# Patient Record
Sex: Female | Born: 1985 | Race: Black or African American | Hispanic: No | Smoking: Never smoker
Health system: Southern US, Community
[De-identification: ages and names within clinical notes are randomized; demographics above are authoritative.]

## PROBLEM LIST (undated history)

## (undated) DIAGNOSIS — Z789 Other specified health status: Secondary | ICD-10-CM

## (undated) HISTORY — PX: CERVICAL CERCLAGE: SHX1329

---

## 2016-03-03 ENCOUNTER — Encounter (HOSPITAL_COMMUNITY): Payer: Self-pay

## 2016-03-03 ENCOUNTER — Emergency Department (HOSPITAL_COMMUNITY)
Admission: EM | Admit: 2016-03-03 | Discharge: 2016-03-03 | Disposition: A | Discharge status: Home / Self Care | Payer: Medicaid Other | Attending: Emergency Medicine | Admitting: Emergency Medicine

## 2016-03-03 ENCOUNTER — Emergency Department (HOSPITAL_COMMUNITY): Payer: Medicaid Other

## 2016-03-03 DIAGNOSIS — N8300 Follicular cyst of ovary, unspecified side: Secondary | ICD-10-CM

## 2016-03-03 DIAGNOSIS — D259 Leiomyoma of uterus, unspecified: Secondary | ICD-10-CM | POA: Diagnosis not present

## 2016-03-03 DIAGNOSIS — R102 Pelvic and perineal pain: Secondary | ICD-10-CM | POA: Insufficient documentation

## 2016-03-03 DIAGNOSIS — N8301 Follicular cyst of right ovary: Secondary | ICD-10-CM | POA: Diagnosis not present

## 2016-03-03 DIAGNOSIS — R110 Nausea: Secondary | ICD-10-CM | POA: Insufficient documentation

## 2016-03-03 DIAGNOSIS — D509 Iron deficiency anemia, unspecified: Secondary | ICD-10-CM | POA: Diagnosis not present

## 2016-03-03 DIAGNOSIS — R1030 Lower abdominal pain, unspecified: Secondary | ICD-10-CM | POA: Diagnosis present

## 2016-03-03 LAB — WET PREP, GENITAL
Clue Cells Wet Prep HPF POC: NONE SEEN
Sperm: NONE SEEN
Trich, Wet Prep: NONE SEEN
Yeast Wet Prep HPF POC: NONE SEEN

## 2016-03-03 LAB — CBC WITH DIFFERENTIAL/PLATELET
BASOS PCT: 1 %
Basophils Absolute: 0 10*3/uL (ref 0.0–0.1)
EOS PCT: 2 %
Eosinophils Absolute: 0.1 10*3/uL (ref 0.0–0.7)
HCT: 27.6 % — ABNORMAL LOW (ref 36.0–46.0)
Hemoglobin: 7.8 g/dL — ABNORMAL LOW (ref 12.0–15.0)
LYMPHS ABS: 1.1 10*3/uL (ref 0.7–4.0)
Lymphocytes Relative: 28 %
MCH: 17.6 pg — AB (ref 26.0–34.0)
MCHC: 28.3 g/dL — ABNORMAL LOW (ref 30.0–36.0)
MCV: 62.2 fL — AB (ref 78.0–100.0)
MONO ABS: 0.2 10*3/uL (ref 0.1–1.0)
Monocytes Relative: 6 %
NEUTROS ABS: 2.6 10*3/uL (ref 1.7–7.7)
NEUTROS PCT: 63 %
PLATELETS: 273 10*3/uL (ref 150–400)
RBC: 4.44 MIL/uL (ref 3.87–5.11)
RDW: 19 % — ABNORMAL HIGH (ref 11.5–15.5)
WBC: 4 10*3/uL (ref 4.0–10.5)

## 2016-03-03 LAB — COMPREHENSIVE METABOLIC PANEL
ALT: 12 U/L — AB (ref 14–54)
AST: 20 U/L (ref 15–41)
Albumin: 3.9 g/dL (ref 3.5–5.0)
Alkaline Phosphatase: 35 U/L — ABNORMAL LOW (ref 38–126)
Anion gap: 7 (ref 5–15)
BUN: 9 mg/dL (ref 6–20)
CALCIUM: 9 mg/dL (ref 8.9–10.3)
CHLORIDE: 107 mmol/L (ref 101–111)
CO2: 23 mmol/L (ref 22–32)
CREATININE: 0.76 mg/dL (ref 0.44–1.00)
Glucose, Bld: 83 mg/dL (ref 65–99)
Potassium: 3.8 mmol/L (ref 3.5–5.1)
Sodium: 137 mmol/L (ref 135–145)
Total Bilirubin: 0.3 mg/dL (ref 0.3–1.2)
Total Protein: 7 g/dL (ref 6.5–8.1)

## 2016-03-03 LAB — URINALYSIS, ROUTINE W REFLEX MICROSCOPIC
BILIRUBIN URINE: NEGATIVE
Glucose, UA: NEGATIVE mg/dL
HGB URINE DIPSTICK: NEGATIVE
KETONES UR: NEGATIVE mg/dL
Leukocytes, UA: NEGATIVE
Nitrite: NEGATIVE
PROTEIN: NEGATIVE mg/dL
Specific Gravity, Urine: 1.013 (ref 1.005–1.030)
pH: 6.5 (ref 5.0–8.0)

## 2016-03-03 LAB — HCG, QUANTITATIVE, PREGNANCY: hCG, Beta Chain, Quant, S: <1 m[IU]/mL (ref <5)

## 2016-03-03 LAB — LIPASE, BLOOD: LIPASE: 39 U/L (ref 11–51)

## 2016-03-03 LAB — POC URINE PREG, ED: PREG TEST UR: NEGATIVE

## 2016-03-03 MED ORDER — ONDANSETRON 4 MG PO TBDP
8.0000 mg | ORAL_TABLET | Freq: Once | ORAL | Status: AC
  Administered 2016-03-03: 8 mg via ORAL
  Filled 2016-03-03: qty 2

## 2016-03-03 MED ORDER — ONDANSETRON HCL 4 MG/2ML IJ SOLN: 4.0000 mg | Freq: Once | INTRAMUSCULAR | Status: DC

## 2016-03-03 MED ORDER — ONDANSETRON HCL 8 MG PO TABS: 8.0000 mg | ORAL_TABLET | Freq: Three times a day (TID) | ORAL | 0 refills | Status: DC | PRN

## 2016-03-03 MED ORDER — SODIUM CHLORIDE 0.9 % IV BOLUS (SEPSIS): 1000.0000 mL | Freq: Once | INTRAVENOUS | Status: DC

## 2016-03-03 MED ORDER — FERROUS SULFATE 325 (65 FE) MG PO TABS: 325.0000 mg | ORAL_TABLET | Freq: Every day | ORAL | 0 refills | Status: DC

## 2016-03-03 NOTE — ED Notes (Signed)
Attempted IV unsuccessfully  

## 2016-03-03 NOTE — ED Notes (Signed)
EDP at bedside  

## 2016-03-03 NOTE — ED Notes (Signed)
Attempted peripheral IV x2. PA aware.

## 2016-03-03 NOTE — ED Notes (Addendum)
Patient transported to Ultrasound 

## 2016-03-03 NOTE — ED Provider Notes (Signed)
[facility] DEPT Provider Note   CSN: [id number] Arrival date & time: 03/03/16  1054     History   Chief Complaint Chief Complaint  Patient presents with  . Abdominal Pain    HPI Rhonda Hoover is a 30 y.o. [phone] female with a PMHx of anemia and uterine fibroids, and PSHx of 2 C/S and cervical cerclage, who presents to the ED with complaints of suprapubic abdominal pain that began yesterday. She describes the pain is 8/10 intermittent sharp suprapubic pain radiating into the right lower quadrant, worse with movement, and with no treatments tried prior to arrival. Associated symptoms include some nausea. She took 2 home pregnancy tests which were both positive. She has had 6 pregnancies including this current one, 2 prior SAbs at 16 and 20 wks, and 2 preterm deliveries at 31wks and 35 wks, 1 term delivery; has 3 living biological children and one adopted child.  No hx of ectopic pregnancies that she's aware of, and aside from cervical incompetency and preterm deliveries, she hasn't had any additional issues with her prior pregnancies. Typically has a regular menses, but LMP 01/14/16 and then had some mild spotting on 02/13/16 but it wasn't a full menses so she counts that as a missed menstrual cycle. No other prior abd surgeries aside from C-sections mentioned above. She is sexually active with one female partner, protected with condoms. Does not take any hormonal birth control therapies.  She denies any fevers, chills, chest pain, shortness breath, recent vomiting, diarrhea, constipation, melena, hematochezia, obstipation, dysuria, hematuria, vaginal bleeding or discharge, genital sores or lesions, vaginal itching, numbness, tingling, focal weakness, recent antibiotic use, sick contacts, suspicious food intake, recent travel, alcohol use, or chronic NSAID use.   The history is provided by the patient. No language interpreter was used.  Abdominal Pain   This is a new problem. The current episode  started yesterday. Episode frequency: intermittently. The problem has not changed since onset.The pain is associated with an unknown factor. The pain is located in the suprapubic region. The quality of the pain is sharp. The pain is at a severity of 8/10. The pain is moderate. Associated symptoms include nausea. Pertinent negatives include fever, diarrhea, flatus, hematochezia, melena, vomiting, constipation, dysuria, hematuria, arthralgias and myalgias. The symptoms are aggravated by activity. Nothing relieves the symptoms. Past medical history comments: uterine fibroids, prior miscarriages.    History reviewed. No pertinent past medical history.  There are no active problems to display for this patient.   Past Surgical History:  Procedure Laterality Date  . CESAREAN SECTION     x2    OB History    Gravida Para Term Preterm AB Living   1             SAB TAB Ectopic Multiple Live Births                   Home Medications    Prior to Admission medications   Not on File    Family History No family history on file.  Social History Social History  Substance Use Topics  . Smoking status: Not on file  . Smokeless tobacco: Not on file  . Alcohol use Not on file     Allergies   Penicillins   Review of Systems Review of Systems  Constitutional: Negative for chills and fever.  Respiratory: Negative for shortness of breath.   Cardiovascular: Negative for chest pain.  Gastrointestinal: Positive for abdominal pain (suprapubic) and nausea. Negative for blood in  stool, constipation, diarrhea, flatus, hematochezia, melena and vomiting.  Genitourinary: Positive for menstrual problem (missed menses this month). Negative for dysuria, genital sores, hematuria, vaginal bleeding, vaginal discharge and vaginal pain.  Musculoskeletal: Negative for arthralgias and myalgias.  Skin: Negative for color change.  Allergic/Immunologic: Negative for immunocompromised state.  Neurological:  Negative for weakness and numbness.  Psychiatric/Behavioral: Negative for confusion.   10 Systems reviewed and are negative for acute change except as noted in the HPI.   Physical Exam Updated Vital Signs BP 131/95 (BP Location: Right Arm)   Pulse 74   Temp 98.1 F (36.7 C) (Oral)   Resp 16   LMP 01/14/2016 (Exact Date)   SpO2 100%   Physical Exam  Constitutional: She is oriented to person, place, and time. Vital signs are normal. She appears well-developed and well-nourished.  Non-toxic appearance. No distress.  Afebrile, nontoxic, NAD  HENT:  Head: Normocephalic and atraumatic.  Mouth/Throat: Oropharynx is clear and moist and mucous membranes are normal.  Eyes: Conjunctivae and EOM are normal. Right eye exhibits no discharge. Left eye exhibits no discharge.  Neck: Normal range of motion. Neck supple.  Cardiovascular: Normal rate, regular rhythm, normal heart sounds and intact distal pulses.  Exam reveals no gallop and no friction rub.   No murmur heard. Pulmonary/Chest: Effort normal and breath sounds normal. No respiratory distress. She has no decreased breath sounds. She has no wheezes. She has no rhonchi. She has no rales.  Abdominal: Soft. Normal appearance and bowel sounds are normal. She exhibits no distension. There is tenderness in the suprapubic area. There is no rigidity, no rebound, no guarding, no CVA tenderness, no tenderness at McBurney's point and negative Murphy's sign.    Soft, nondistended, +BS throughout, with mild suprapubic TTP, no appreciable RLQ TTP, no r/g/r, neg murphy's, neg mcburney's, no CVA TTP   Genitourinary: Pelvic exam was performed with patient supine. There is no rash, tenderness or lesion on the right labia. There is no rash, tenderness or lesion on the left labia. Uterus is enlarged and tender. Uterus is not deviated and not fixed. Cervix exhibits no motion tenderness, no discharge and no friability. Right adnexum displays tenderness. Right adnexum  displays no mass and no fullness. Left adnexum displays no mass, no tenderness and no fullness. No erythema, tenderness or bleeding in the vagina. Vaginal discharge (scant white) found.  Genitourinary Comments: Chaperone present for exam. No rashes, lesions, or tenderness to external genitalia. No erythema, injury, or tenderness to vaginal mucosa. +Scant white vaginal discharge without bleeding within vaginal vault. No adnexal masses or fullness, but some mild R adnexal tenderness. No CMT, cervical friability, or discharge from cervical os. Cervical os is closed. Uterus non-deviated, mobile, with slight diffuse TTP and mild enlargement up to pubic symphysis.  Musculoskeletal: Normal range of motion.  Neurological: She is alert and oriented to person, place, and time. She has normal strength. No sensory deficit.  Skin: Skin is warm, dry and intact. No rash noted.  Psychiatric: She has a normal mood and affect.  Nursing note and vitals reviewed.    ED Treatments / Results  Labs (all labs ordered are listed, but only abnormal results are displayed) Labs Reviewed  WET PREP, GENITAL - Abnormal; Notable for the following:       Result Value   WBC, Wet Prep HPF POC MODERATE (*)    All other components within normal limits  CBC WITH DIFFERENTIAL/PLATELET - Abnormal; Notable for the following:    Hemoglobin 7.8 (*)  HCT 27.6 (*)    MCV 62.2 (*)    MCH 17.6 (*)    MCHC 28.3 (*)    RDW 19.0 (*)    All other components within normal limits  COMPREHENSIVE METABOLIC PANEL - Abnormal; Notable for the following:    ALT 12 (*)    Alkaline Phosphatase 35 (*)    All other components within normal limits  URINALYSIS, ROUTINE W REFLEX MICROSCOPIC (NOT AT [facility]) - Abnormal; Notable for the following:    Color, Urine AMBER (*)    All other components within normal limits  LIPASE, BLOOD  HCG, QUANTITATIVE, PREGNANCY  RPR  HIV ANTIBODY (ROUTINE TESTING)  POC URINE PREG, ED  GC/CHLAMYDIA PROBE AMP  ([place]) NOT AT [facility]    EKG  EKG Interpretation None       Radiology US Transvaginal Non-ob  Result Date: 03/03/2016 CLINICAL DATA:  Right pelvic pain starting yesterday EXAM: TRANSABDOMINAL AND TRANSVAGINAL ULTRASOUND OF PELVIS DOPPLER ULTRASOUND OF OVARIES TECHNIQUE: Both transabdominal and transvaginal ultrasound examinations of the pelvis were performed. Transabdominal technique was performed for global imaging of the pelvis including uterus, ovaries, adnexal regions, and pelvic cul-de-sac. It was necessary to proceed with endovaginal exam following the transabdominal exam to visualize the ovaries. Color and duplex Doppler ultrasound was utilized to evaluate blood flow to the ovaries. COMPARISON:  None. FINDINGS: Uterus Measurements: 8.4 x 5.6 x 8.7 cm. The uterus is retroflexed. There is a left posterior fundal calcified myometrial lesion measures 5.4 by 4.6 cm. This is suspicious for a calcified fibroid. Correlation with GYN exam and further correlation with CT scan or MRI is recommended. Endometrium Thickness: 1.6 cm.  No focal abnormality visualized. Right ovary Measurements: 3.6 x 2.3 by 2.7 cm. A complex follicle is noted measures 1.5 x 1.2 cm. Left ovary Measurements: 3.3 x 0.7 x 2 cm. Normal appearance/no adnexal mass. Pulsed Doppler evaluation of both ovaries demonstrates normal low-resistance arterial and venous waveforms. Other findings Trace free fluid. IMPRESSION: 1. Retroflexed uterus. Probable calcified left fundal posterior fibroid measures 5.4 x 4.6 cm. Further correlation with CT scan or MRI is recommended. 2. No adnexal mass. Complex follicle right ovary measures 1.5 x 1.2 cm. Trace pelvic free fluid. Electronically Signed   By: Lahoma Crocker M.D.   On: 03/03/2016 13:15   US Pelvis Complete  Result Date: 03/03/2016 CLINICAL DATA:  Right pelvic pain starting yesterday EXAM: TRANSABDOMINAL AND TRANSVAGINAL ULTRASOUND OF PELVIS DOPPLER ULTRASOUND OF OVARIES TECHNIQUE: Both  transabdominal and transvaginal ultrasound examinations of the pelvis were performed. Transabdominal technique was performed for global imaging of the pelvis including uterus, ovaries, adnexal regions, and pelvic cul-de-sac. It was necessary to proceed with endovaginal exam following the transabdominal exam to visualize the ovaries. Color and duplex Doppler ultrasound was utilized to evaluate blood flow to the ovaries. COMPARISON:  None. FINDINGS: Uterus Measurements: 8.4 x 5.6 x 8.7 cm. The uterus is retroflexed. There is a left posterior fundal calcified myometrial lesion measures 5.4 by 4.6 cm. This is suspicious for a calcified fibroid. Correlation with GYN exam and further correlation with CT scan or MRI is recommended. Endometrium Thickness: 1.6 cm.  No focal abnormality visualized. Right ovary Measurements: 3.6 x 2.3 by 2.7 cm. A complex follicle is noted measures 1.5 x 1.2 cm. Left ovary Measurements: 3.3 x 0.7 x 2 cm. Normal appearance/no adnexal mass. Pulsed Doppler evaluation of both ovaries demonstrates normal low-resistance arterial and venous waveforms. Other findings Trace free fluid. IMPRESSION: 1. Retroflexed uterus. Probable calcified left  fundal posterior fibroid measures 5.4 x 4.6 cm. Further correlation with CT scan or MRI is recommended. 2. No adnexal mass. Complex follicle right ovary measures 1.5 x 1.2 cm. Trace pelvic free fluid. Electronically Signed   By: Lahoma Crocker M.D.   On: 03/03/2016 13:15   [place] Art/ven Flow Abd Pelv Doppler  Result Date: 03/03/2016 CLINICAL DATA:  Right pelvic pain starting yesterday EXAM: TRANSABDOMINAL AND TRANSVAGINAL ULTRASOUND OF PELVIS DOPPLER ULTRASOUND OF OVARIES TECHNIQUE: Both transabdominal and transvaginal ultrasound examinations of the pelvis were performed. Transabdominal technique was performed for global imaging of the pelvis including uterus, ovaries, adnexal regions, and pelvic cul-de-sac. It was necessary to proceed with endovaginal exam  following the transabdominal exam to visualize the ovaries. Color and duplex Doppler ultrasound was utilized to evaluate blood flow to the ovaries. COMPARISON:  None. FINDINGS: Uterus Measurements: 8.4 x 5.6 x 8.7 cm. The uterus is retroflexed. There is a left posterior fundal calcified myometrial lesion measures 5.4 by 4.6 cm. This is suspicious for a calcified fibroid. Correlation with GYN exam and further correlation with CT scan or MRI is recommended. Endometrium Thickness: 1.6 cm.  No focal abnormality visualized. Right ovary Measurements: 3.6 x 2.3 by 2.7 cm. A complex follicle is noted measures 1.5 x 1.2 cm. Left ovary Measurements: 3.3 x 0.7 x 2 cm. Normal appearance/no adnexal mass. Pulsed Doppler evaluation of both ovaries demonstrates normal low-resistance arterial and venous waveforms. Other findings Trace free fluid. IMPRESSION: 1. Retroflexed uterus. Probable calcified left fundal posterior fibroid measures 5.4 x 4.6 cm. Further correlation with CT scan or MRI is recommended. 2. No adnexal mass. Complex follicle right ovary measures 1.5 x 1.2 cm. Trace pelvic free fluid. Electronically Signed   By: Lahoma Crocker M.D.   On: 03/03/2016 13:15    Procedures Procedures (including critical care time)  Medications Ordered in ED Medications  ondansetron (ZOFRAN-ODT) disintegrating tablet 8 mg (8 mg Oral Given 03/03/16 1418)     Initial Impression / Assessment and Plan / ED Course  I have reviewed the triage vital signs and the nursing notes.  Pertinent labs & imaging results that were available during my care of the patient were reviewed by me and considered in my medical decision making (see chart for details).  Clinical Course    30 y.o. [id number] female here with intermittent suprapubic/RLQ pain x1 day, +UPT at home x2 (EGA [redacted]w[redacted]d based on LMP), some nausea but no recent vomiting. No known prior ectopic pregnancies, but has had 2 preterm deliveries and 2 SAbs (16 and 20 wks). Has hx of  fibroids. 2 C/S surgeries but no other abd surgeries. No vaginal bleeding/discharge. On exam, mild suprapubic TTP, no RLQ TTP appreciated, nonperitoneal exam, no flank TTP. Will obtain CBC w/diff, CMP, lipase, U/A, Upreg, quant HCG, STD panel, wet prep and pelvic exam; will give fluids and zofran, pt declines wanting pain meds. Will hold off on ordering ABO/Rh given that she has no vaginal bleeding. Will hold off on U/S orders until we have a quantHCG so we know how to proceed. Will reassess shortly  11:41 AM Pelvic exam reveals some scant white discharge in vault, no cervical discharge, closed cervical os, no vaginal bleeding, no CMT but some mild diffuse uterine tenderness and enlargement up to pubic symphysis. Remaining labs not yet done, so unclear what her quantHCG will be; will proceed with ordering U/S but will obviously need to wait for quantHCG to determine which type of U/S to get. Will hold off on  empiric GC/CT coverage until wet prep results, low suspicion for PID/STD at this time. Will reassess after labs/imaging returns.   11:55 AM Upreg neg, changed U/S orders to be transvaginal non-OB, pelvis complete, and doppler to r/o torsion vs other etiologies. Remaining labs pending. Will reassess shortly.   2:31 PM CBC w/diff with Hgb 7.8 (unclear baseline but pt recalls being told she was anemic previously, can't recall her baseline Hgb though), microcytic so likely iron deficiency, will need to start on iron supplementation; differential and platelet count unremarkable, polychromasia and elliptocytes and burr cells present on smear (unclear if this is a new finding or not). CMP WNL. Lipase WNL. U/A WNL and without evidence of UTI. Quant HCG <1 so could be that she recently miscarried vs false+ test at home, but either way she shouldn't need any further intervention at this time. Wet prep with some WBCs but otherwise neg, doubt need for empiric GC/CT treatment, will await results from testing  performed today, discussed abstinence until she gets results of testing. U/S reveals retroflexed uterus and probably calcified L fundal posterior fibroid, and complex follicle on R ovary, otherwise unremarkable. These could explain her symptoms, she will need OBGYN f/up for management/eval of her fibroids. Unable to obtain IV access, but pt tolerating PO well and feeling well/better after zofran, continued to decline wanting anything for pain. Will d/c home with zofran and iron, discussed use of tylenol/motrin for pain, and f/up with OBGYN/women's clinic for ongoing management of her pelvic pain/fibroids and with her PCP for management of her anemia; no need for transfusion emergently today but discussed strict return precautions of s/sx of worsening anemia that would warrant recheck/emergent treatment. I explained the diagnosis and have given explicit precautions to return to the ER including for any other new or worsening symptoms. The patient understands and accepts the medical plan as it's been dictated and I have answered their questions. Discharge instructions concerning home care and prescriptions have been given. The patient is STABLE and is discharged to home in good condition.    Final Clinical Impressions(s) / ED Diagnoses   Final diagnoses:  Suprapubic pain  Nausea  Right adnexal tenderness  Uterine leiomyoma, unspecified location  Ovarian follicular cyst  Iron deficiency anemia, unspecified iron deficiency anemia type    New Prescriptions New Prescriptions   FERROUS SULFATE 325 (65 FE) MG TABLET    Take 1 tablet (325 mg total) by mouth daily with breakfast. TAKE WITH ORANGE JUICE   ONDANSETRON (ZOFRAN) 8 MG TABLET    Take 1 tablet (8 mg total) by mouth every 8 (eight) hours as needed for nausea or vomiting.     Raquelle Pietro Camprubi-Soms, PA-C 03/03/16 1432    Isla Pence, MD 03/04/16 [phone]

## 2016-03-03 NOTE — ED Triage Notes (Signed)
Pt Presents for evaluation of sharp suprapubic pain x 2 days. Pt. Reports LMP 01/14/16, reports 2 positive home pregnancy tests. Pt. Has 4 healthy children prior, last baby was 31 weeks at birth. Pt. Reports hx of fibroids. Pt. Reports pain is intermittent and requires her to "stop what shes doing." Pain worse with movement.

## 2016-03-03 NOTE — Discharge Instructions (Signed)
Your abdominal pain is likely related to your uterine fibroids and a small follicular cyst on your right ovary that was seen on ultrasound. You may have miscarried earlier this month, but today's labs and imaging reassure [place] that you are not currently pregnant and should not need further intervention for any possible miscarriage that may have occurred earlier this month. Your labs revealed that you are anemic, start taking iron supplements as directed. Use zofran as directed as needed for nausea. Stay well hydrated with plenty of fluids. Use tylenol and motrin as needed for pain. Follow up with your primary care doctor and your OBGYN in the next 1 week for ongoing evaluation and management of your symptoms; if you don't have an OBGYN then follow up with the women's clinic to establish care and recheck your symptoms and for further management of your fibroids/pelvic pain. Return to the women's hospital [facility] (what they call their ER) as needed for changes/worsening symptoms.   You have been tested for gonorrhea, chlamydia, HIV, and Syphilis, and the hospital will call you if the lab is positive. DO NOT ENGAGE IN SEXUAL ACTIVITY UNTIL YOU FIND OUT ABOUT YOUR RESULTS. ALL PARTNERS MUST BE TESTED AND TREATED FOR STD'S PRIOR TO RE-ENGAGING IN SEXUAL ACTIVITY. ALWAYS USE CONDOMS WHEN ENGAGING IN INTERCOURSE.

## 2016-03-04 LAB — RPR: RPR: NONREACTIVE

## 2016-03-04 LAB — HIV ANTIBODY (ROUTINE TESTING W REFLEX): HIV Screen 4th Generation wRfx: NONREACTIVE

## 2016-03-05 LAB — GC/CHLAMYDIA PROBE AMP (~~LOC~~) NOT AT ARMC
Chlamydia: NEGATIVE
NEISSERIA GONORRHEA: NEGATIVE

## 2017-01-09 ENCOUNTER — Emergency Department (HOSPITAL_COMMUNITY): Payer: Self-pay

## 2017-01-09 ENCOUNTER — Emergency Department (HOSPITAL_COMMUNITY)
Admission: EM | Admit: 2017-01-09 | Discharge: 2017-01-10 | Disposition: A | Discharge status: Home / Self Care | Payer: Self-pay | Attending: Emergency Medicine | Admitting: Emergency Medicine

## 2017-01-09 ENCOUNTER — Encounter (HOSPITAL_COMMUNITY): Payer: Self-pay | Admitting: *Deleted

## 2017-01-09 DIAGNOSIS — H8110 Benign paroxysmal vertigo, unspecified ear: Secondary | ICD-10-CM | POA: Insufficient documentation

## 2017-01-09 DIAGNOSIS — M5416 Radiculopathy, lumbar region: Secondary | ICD-10-CM | POA: Insufficient documentation

## 2017-01-09 DIAGNOSIS — Z880 Allergy status to penicillin: Secondary | ICD-10-CM | POA: Insufficient documentation

## 2017-01-09 LAB — POC URINE PREG, ED: PREG TEST UR: NEGATIVE

## 2017-01-09 MED ORDER — PREDNISONE 10 MG PO TABS: ORAL_TABLET | ORAL | 0 refills | Status: DC

## 2017-01-09 MED ORDER — CYCLOBENZAPRINE HCL 10 MG PO TABS: 10.0000 mg | ORAL_TABLET | Freq: Two times a day (BID) | ORAL | 0 refills | Status: DC | PRN

## 2017-01-09 NOTE — ED Provider Notes (Signed)
[facility] DEPT Provider Note   CSN: [id number] Arrival date & time: 01/09/17  1907     History   Chief Complaint Chief Complaint  Patient presents with  . Back Pain    HPI Rhonda Hoover is a 31 y.o. female who presents to the emergency department with a chief complaint of low back pain. She reports a history of recurrent episodes of low back pain with radiation down her left leg. She reports the most recent episode began when she awoke this morning, but concerned her because she states that she felt something "shift" in her low back right when the pain began. She reports that the sharp pain radiates down her left hip and all the way down to her toes. She denies right-sided back pain, fever, chills, urinary or fecal incontinence, numbness, or weakness. No history of IV drug use. No history of cancer. She states that she is unable to walk, but she feels "pulling" in her left leg with ambulation.  She reports a history of migraines, and states that she has had an increase in the number of migraines over the last month. No current headache. She reports increasing instances of diplopia and dizziness with her headaches. She states that the dizziness will last for 2-3 seconds and is aggravated with positional changes. No other symptoms at this time. She reports that she's been treating her headaches with anti-inflammatories, with complete resolution.  Patient has a history of 3 C-sections and multiple cerclages. She denies dysuria, hematuria, frequency, or hesitancy, or vaginal pain, itching, or discharge.  The history is provided by the patient. No language interpreter was used.    History reviewed. No pertinent past medical history.  There are no active problems to display for this patient.   Past Surgical History:  Procedure Laterality Date  . CESAREAN SECTION     x2    OB History    Gravida Para Term Preterm AB Living   1             SAB TAB Ectopic Multiple Live Births               Home Medications    Prior to Admission medications   Medication Sig Start Date End Date Taking? Authorizing Provider  cyclobenzaprine (FLEXERIL) 10 MG tablet Take 1 tablet (10 mg total) by mouth 2 (two) times daily as needed for muscle spasms. 01/09/17   Ryhanna Dunsmore A, PA-C  ferrous sulfate 325 (65 FE) MG tablet Take 1 tablet (325 mg total) by mouth daily with breakfast. TAKE WITH ORANGE JUICE 03/03/16   Street, [place], PA-C  ondansetron (ZOFRAN) 8 MG tablet Take 1 tablet (8 mg total) by mouth every 8 (eight) hours as needed for nausea or vomiting. 03/03/16   Street, [place], PA-C  predniSONE (DELTASONE) 10 MG tablet Please take 6 tablets 4 days 1 through 5, 5 tablets on day 6, 4 tablets on day 7, 3 tablets on day 8, 2 tablets on day 9, and 1 tablet on day 10. 01/09/17   Talulah Schirmer A, PA-C    Family History History reviewed. No pertinent family history.  Social History Social History  Substance Use Topics  . Smoking status: Never Smoker  . Smokeless tobacco: Not on file  . Alcohol use No     Allergies   Penicillins   Review of Systems Review of Systems  Constitutional: Negative for activity change, chills and fever.  Respiratory: Negative for shortness of breath.   Cardiovascular: Negative for chest  pain.  Gastrointestinal: Negative for abdominal pain.  Musculoskeletal: Positive for arthralgias, gait problem and myalgias. Negative for back pain and joint swelling.  Skin: Negative for rash.  Neurological: Positive for dizziness and headaches. Negative for syncope, weakness, light-headedness and numbness.   Physical Exam Updated Vital Signs BP 107/73 (BP Location: Right Arm)   Pulse 80   Temp 98.8 F (37.1 C) (Oral)   Resp 16   LMP 01/02/2017   SpO2 99%   Breastfeeding? Unknown   Physical Exam  Constitutional: No distress.  HENT:  Head: Normocephalic.  Eyes: Conjunctivae are normal.  Neck: Neck supple.  Cardiovascular: Normal rate and regular  rhythm.  Exam reveals no gallop and no friction rub.   No murmur heard. Pulmonary/Chest: Effort normal. No respiratory distress.  Abdominal: Soft. She exhibits no distension.  Musculoskeletal: She exhibits tenderness. She exhibits no edema or deformity.  Tender to palpation over the spinous processes of the lumbar spine. Tender to palpation over the left-sided paraspinal muscles of the lumbar spine. No right-sided tenderness. No tenderness to palpation over the cervical or thoracic spinous processes or surrounding paraspinal muscles.  Positive straight-leg raise on the left. Full range of motion of the bilateral hips, knees, and ankles. No tenderness to palpation over the bilateral hips, knees, or ankles. 5 out of 5 strength of the bilateral lower extremities. Sensation to light touch is intact throughout the bilateral lower extremities. DP and PT pulses are 2+ bilaterally.  Neurological: She is alert.  Skin: Skin is warm. No rash noted.  Psychiatric: Her behavior is normal.  Nursing note and vitals reviewed.    ED Treatments / Results  Labs (all labs ordered are listed, but only abnormal results are displayed) Labs Reviewed  POC URINE PREG, ED    EKG  EKG Interpretation None       Radiology Dg Lumbar Spine Complete  Result Date: 01/09/2017 CLINICAL DATA:  Left lower back pain EXAM: LUMBAR SPINE - COMPLETE 4+ VIEW COMPARISON:  None. FINDINGS: Calcified fibroid in the pelvis. Normal alignment. Vertebral body heights and disc spaces are within normal limits IMPRESSION: Negative.  Calcified uterine fibroid Electronically Signed   By: Donavan Foil M.D.   On: 01/09/2017 23:34    Procedures Procedures (including critical care time)  Medications Ordered in ED Medications - No data to display   Initial Impression / Assessment and Plan / ED Course  I have reviewed the triage vital signs and the nursing notes.  Pertinent labs & imaging results that were available during my care of  the patient were reviewed by me and considered in my medical decision making (see chart for details).     Patient with back pain, likely lumbar radiculopathy.  No neurological deficits and normal neuro exam.  Patient can walk but states it is painful.  No urinary or bowel incontinence.  No concern for cauda equina, AAA, infection, or fracture.  No constitutional symptoms including fever, chills, or weight loss,  No h/o cancer, IVDU.  Will discharge to home with RICE protocol, prednisone taper, and anti-inflammatory medicine. Discussed ED return precaution and indications for PCP follow up. The patient acknowledges the plan and is agreeable at this time.   Final Clinical Impressions(s) / ED Diagnoses   Final diagnoses:  Lumbar radiculopathy  Benign paroxysmal positional vertigo, unspecified laterality    New Prescriptions Discharge Medication List as of 01/09/2017 11:59 PM    START taking these medications   Details  cyclobenzaprine (FLEXERIL) 10 MG tablet Take 1  tablet (10 mg total) by mouth 2 (two) times daily as needed for muscle spasms., Starting Wed 01/09/2017, Print    predniSONE (DELTASONE) 10 MG tablet Please take 6 tablets 4 days 1 through 5, 5 tablets on day 6, 4 tablets on day 7, 3 tablets on day 8, 2 tablets on day 9, and 1 tablet on day 10., Print         Joanne Gavel, PA-C 01/10/17 0125    Milton Ferguson, MD 01/10/17 1258

## 2017-01-09 NOTE — Discharge Instructions (Signed)
If you develop new or worsening symptoms, including symptoms in both legs, heaviness in her legs, or numbness, or loss of control of your bladder or bowels, fever, or chills, please return to the emergency department.  Please take 6 tablets of prednisone starting tomorrow for the next 5 days. On day 6, please take 5 tablets. On day 7, please take 4 tablets. On day 8, please take 3 tablets. On day 9, please take 2 tablets. On day 10, please take one tablet. Please make sure to take the tablets with food so they do not upset your stomach. You may also take Flexeril up to 2 times per day to help with muscle pain and spasms please do not take this medication before you work or drive because it can make you sleepy. You can apply ice to any areas that are sore for 15-20 minutes up to 3-4 times a day.   Stretches And symptoms also help with your symptoms. Here are some stretches that may help the muscles in your lower back.   Stretches for Low Back Pain      Back Flexion Stretch. Lying on the back, pull both knees to the chest while simultaneously flexing the head forward until a comfortable stretch is felt across the mid and low back.      Knee to Chest Stretch. Lie on the back with the knees bent and both heels on the floor, then place both hands behind one knee and pull it toward the chest, stretching the gluteus and piriformis muscles in the buttock.  Kneeling Lunge Stretch. Starting on both knees, move one leg forward so the foot is flat on the ground, keeping weight evenly distributed through both hips (rather than on one side or the other). Place both hands on the top of the thigh, and gently lean the body forward to feel a stretch in the front of the other leg. This stretch affects the hip flexor muscles, which attach to the pelvis and can impact posture if too tight.    Piriformis Muscle Stretch. Lie on the back with knees bent and both heels on the floor. Cross one leg over the other, resting the  ankle on the bent knee, then gently pull the bottom knee toward the chest until a stretch is felt in the buttock. Or, lying on the floor, cross one leg over the other and pull it forward over the body at the knee, keeping the other leg flat.

## 2017-01-09 NOTE — ED Triage Notes (Signed)
Pt reports left lower back pain for extended amount of time getting progressively worse. Denies injury to back. Is ambulatory at triage. States pain is radiating down her left leg. Denies urinary symptoms.

## 2018-05-27 ENCOUNTER — Emergency Department (HOSPITAL_COMMUNITY)
Admission: EM | Admit: 2018-05-27 | Discharge: 2018-05-27 | Disposition: A | Discharge status: Home / Self Care | Payer: Medicaid Other | Attending: Emergency Medicine | Admitting: Emergency Medicine

## 2018-05-27 DIAGNOSIS — R319 Hematuria, unspecified: Secondary | ICD-10-CM | POA: Insufficient documentation

## 2018-05-27 DIAGNOSIS — M545 Low back pain: Secondary | ICD-10-CM | POA: Diagnosis not present

## 2018-05-27 DIAGNOSIS — R300 Dysuria: Secondary | ICD-10-CM | POA: Diagnosis present

## 2018-05-27 DIAGNOSIS — Z79899 Other long term (current) drug therapy: Secondary | ICD-10-CM | POA: Insufficient documentation

## 2018-05-27 DIAGNOSIS — N390 Urinary tract infection, site not specified: Secondary | ICD-10-CM | POA: Diagnosis not present

## 2018-05-27 LAB — URINALYSIS, ROUTINE W REFLEX MICROSCOPIC
Bilirubin Urine: NEGATIVE
Glucose, UA: NEGATIVE mg/dL
Ketones, ur: NEGATIVE mg/dL
Nitrite: NEGATIVE
Protein, ur: NEGATIVE mg/dL
Specific Gravity, Urine: 1.005 (ref 1.005–1.030)
WBC, UA: >50 WBC/hpf — ABNORMAL HIGH (ref 0–5)
pH: 6 (ref 5.0–8.0)

## 2018-05-27 LAB — POC URINE PREG, ED: Preg Test, Ur: NEGATIVE

## 2018-05-27 MED ORDER — CEPHALEXIN 500 MG PO CAPS: 500.0000 mg | ORAL_CAPSULE | Freq: Four times a day (QID) | ORAL | 0 refills | Status: DC

## 2018-05-27 NOTE — Discharge Instructions (Addendum)
Please read attached information. If you experience any new or worsening signs or symptoms please return to the emergency room for evaluation. Please follow-up with your primary care provider or specialist as discussed. Please use medication prescribed only as directed and discontinue taking if you have any concerning signs or symptoms.   °

## 2018-05-27 NOTE — ED Notes (Signed)
Patient verbalized understanding of discharge instructions and denies any further needs or questions at this time. VS stable. Patient ambulatory with steady gait.  

## 2018-05-27 NOTE — ED Provider Notes (Signed)
[facility] EMERGENCY DEPARTMENT Provider Note   CSN: [id number] Arrival date & time: 05/27/18  1342    History   Chief Complaint Chief Complaint  Patient presents with  . Dysuria  . Back Pain    HPI Rhonda Hoover is a 33 y.o. female.  HPI   33 year old female with no significant past medical history presents today with complaints of dysuria.  Patient notes approximately 2 weeks ago she developed an odor, pain after urinating and suprapubic discomfort.  She notes this feels similar to previous urinary tract infections although slightly more severe.  She denies any recent infections or antibiotic use.  She notes yesterday she developed some minor bilateral low back pain.  She denies any fever nausea or vomiting.  She denies any upper abdominal pain.  No past medical history on file.  There are no active problems to display for this patient.   Past Surgical History:  Procedure Laterality Date  . CESAREAN SECTION     x2     OB History    Gravida  1   Para      Term      Preterm      AB      Living        SAB      TAB      Ectopic      Multiple      Live Births               Home Medications    Prior to Admission medications   Medication Sig Start Date End Date Taking? Authorizing Provider  cephALEXin (KEFLEX) 500 MG capsule Take 1 capsule (500 mg total) by mouth 4 (four) times daily. 05/27/18   Kellina Dreese, Dellis Filbert, PA-C  cyclobenzaprine (FLEXERIL) 10 MG tablet Take 1 tablet (10 mg total) by mouth 2 (two) times daily as needed for muscle spasms. 01/09/17   McDonald, Mia A, PA-C  ferrous sulfate 325 (65 FE) MG tablet Take 1 tablet (325 mg total) by mouth daily with breakfast. TAKE WITH ORANGE JUICE 03/03/16   Street, [place], PA-C  ondansetron (ZOFRAN) 8 MG tablet Take 1 tablet (8 mg total) by mouth every 8 (eight) hours as needed for nausea or vomiting. 03/03/16   Street, [place], PA-C  predniSONE (DELTASONE) 10 MG tablet Please take 6  tablets 4 days 1 through 5, 5 tablets on day 6, 4 tablets on day 7, 3 tablets on day 8, 2 tablets on day 9, and 1 tablet on day 10. 01/09/17   McDonald, Mia A, PA-C    Family History No family history on file.  Social History Social History   Tobacco Use  . Smoking status: Never Smoker  Substance Use Topics  . Alcohol use: No  . Drug use: No     Allergies   Penicillins   Review of Systems Review of Systems  All other systems reviewed and are negative.   Physical Exam Updated Vital Signs BP 132/80 (BP Location: Left Arm)   Pulse 67   Temp 98.2 F (36.8 C) (Oral)   Resp 14   Ht 5\' 7"  (1.702 m)   Wt 68 kg   LMP 04/28/2018 (Exact Date)   SpO2 100%   BMI 23.49 kg/m   Physical Exam Vitals signs and nursing note reviewed.  Constitutional:      Appearance: She is well-developed.  HENT:     Head: Normocephalic and atraumatic.  Eyes:     General: No scleral  icterus.       Right eye: No discharge.        Left eye: No discharge.     Conjunctiva/sclera: Conjunctivae normal.     Pupils: Pupils are equal, round, and reactive to light.  Neck:     Musculoskeletal: Normal range of motion.     Vascular: No JVD.     Trachea: No tracheal deviation.  Pulmonary:     Effort: Pulmonary effort is normal.     Breath sounds: No stridor.  Abdominal:     Comments: Abdomen soft nontender no CVA tenderness  Neurological:     Mental Status: She is alert and oriented to person, place, and time.     Coordination: Coordination normal.  Psychiatric:        Behavior: Behavior normal.        Thought Content: Thought content normal.        Judgment: Judgment normal.     ED Treatments / Results  Labs (all labs ordered are listed, but only abnormal results are displayed) Labs Reviewed  URINALYSIS, ROUTINE W REFLEX MICROSCOPIC - Abnormal; Notable for the following components:      Result Value   Color, Urine STRAW (*)    APPearance HAZY (*)    Hgb urine dipstick SMALL (*)     Leukocytes, UA LARGE (*)    WBC, UA >50 (*)    Bacteria, UA FEW (*)    All other components within normal limits  POC URINE PREG, ED    EKG None  Radiology No results found.  Procedures Procedures (including critical care time)  Medications Ordered in ED Medications - No data to display   Initial Impression / Assessment and Plan / ED Course  I have reviewed the triage vital signs and the nursing notes.  Pertinent labs & imaging results that were available during my care of the patient were reviewed by me and considered in my medical decision making (see chart for details).      Labs:   Imaging:  Consults:  Therapeutics:  Discharge Meds:   Assessment/Plan: 33 year old female presents today with complaints urinary tract infection.  She has no CVA tenderness but does complain of back pain.  Her urine consistent with urinary tract infection.  I do find it reasonable to treat her with higher dose Keflex.  She has no known anaphylactic reaction to penicillins.  Patient be given antibiotics, strict return precautions.  She verbalized understanding and agreement to today's plan.   Final Clinical Impressions(s) / ED Diagnoses   Final diagnoses:  Urinary tract infection with hematuria, site unspecified    ED Discharge Orders         Ordered    cephALEXin (KEFLEX) 500 MG capsule  4 times daily     05/27/18 1516           Okey Regal, PA-C 05/27/18 1543    Valarie Merino, MD 05/27/18 [phone]

## 2018-10-22 ENCOUNTER — Inpatient Hospital Stay (HOSPITAL_COMMUNITY)
Admission: AD | Admit: 2018-10-22 | Discharge: 2018-10-22 | Disposition: A | Discharge status: Home / Self Care | Payer: Medicaid Other | Attending: Obstetrics & Gynecology | Admitting: Obstetrics & Gynecology

## 2018-10-22 ENCOUNTER — Other Ambulatory Visit: Payer: Self-pay

## 2018-10-22 ENCOUNTER — Encounter (HOSPITAL_COMMUNITY): Payer: Self-pay | Admitting: *Deleted

## 2018-10-22 ENCOUNTER — Inpatient Hospital Stay (HOSPITAL_COMMUNITY): Payer: Medicaid Other

## 2018-10-22 DIAGNOSIS — A5901 Trichomonal vulvovaginitis: Secondary | ICD-10-CM | POA: Diagnosis not present

## 2018-10-22 DIAGNOSIS — O200 Threatened abortion: Secondary | ICD-10-CM

## 2018-10-22 DIAGNOSIS — O98311 Other infections with a predominantly sexual mode of transmission complicating pregnancy, first trimester: Secondary | ICD-10-CM | POA: Insufficient documentation

## 2018-10-22 DIAGNOSIS — A599 Trichomoniasis, unspecified: Secondary | ICD-10-CM

## 2018-10-22 DIAGNOSIS — O209 Hemorrhage in early pregnancy, unspecified: Secondary | ICD-10-CM | POA: Diagnosis present

## 2018-10-22 DIAGNOSIS — Z3A01 Less than 8 weeks gestation of pregnancy: Secondary | ICD-10-CM | POA: Insufficient documentation

## 2018-10-22 DIAGNOSIS — O4690 Antepartum hemorrhage, unspecified, unspecified trimester: Secondary | ICD-10-CM

## 2018-10-22 HISTORY — DX: Other specified health status: Z78.9

## 2018-10-22 LAB — URINALYSIS, ROUTINE W REFLEX MICROSCOPIC
Bilirubin Urine: NEGATIVE
Glucose, UA: NEGATIVE mg/dL
Ketones, ur: NEGATIVE mg/dL
Nitrite: NEGATIVE
Protein, ur: NEGATIVE mg/dL
RBC / HPF: >50 RBC/hpf — ABNORMAL HIGH (ref 0–5)
Specific Gravity, Urine: 1.005 (ref 1.005–1.030)
pH: 7 (ref 5.0–8.0)

## 2018-10-22 LAB — CBC
HCT: 29.8 % — ABNORMAL LOW (ref 36.0–46.0)
Hemoglobin: 8 g/dL — ABNORMAL LOW (ref 12.0–15.0)
MCH: 16.7 pg — ABNORMAL LOW (ref 26.0–34.0)
MCHC: 26.8 g/dL — ABNORMAL LOW (ref 30.0–36.0)
MCV: 62.3 fL — ABNORMAL LOW (ref 80.0–100.0)
Platelets: 333 10*3/uL (ref 150–400)
RBC: 4.78 MIL/uL (ref 3.87–5.11)
RDW: 22.8 % — ABNORMAL HIGH (ref 11.5–15.5)
WBC: 5.9 10*3/uL (ref 4.0–10.5)
nRBC: 0 % (ref 0.0–0.2)

## 2018-10-22 LAB — TYPE AND SCREEN
ABO/RH(D): B POS
Antibody Screen: NEGATIVE

## 2018-10-22 LAB — WET PREP, GENITAL
Clue Cells Wet Prep HPF POC: NONE SEEN
Sperm: NONE SEEN
Yeast Wet Prep HPF POC: NONE SEEN

## 2018-10-22 LAB — HCG, QUANTITATIVE, PREGNANCY: hCG, Beta Chain, Quant, S: 64734 m[IU]/mL — ABNORMAL HIGH (ref <5)

## 2018-10-22 LAB — POCT PREGNANCY, URINE: Preg Test, Ur: POSITIVE — AB

## 2018-10-22 MED ORDER — METRONIDAZOLE 500 MG PO TABS
2000.0000 mg | ORAL_TABLET | Freq: Once | ORAL | Status: AC
Start: 2018-10-22 — End: 2018-10-22
  Administered 2018-10-22: 2000 mg via ORAL
  Filled 2018-10-22: qty 4

## 2018-10-22 NOTE — MAU Note (Signed)
Presents with c/o VB that began that began this morning.  Denies abdominal pain/cramping.  LMP 09/06/2018.  Reports +HPT.

## 2018-10-22 NOTE — ED Triage Notes (Signed)
Pt in with vaginal bleeding since this am, states +home preg test, LMP late April. Transport called for MAU transfer

## 2018-10-22 NOTE — Discharge Instructions (Signed)
Trichomoniasis Trichomoniasis is an STI (sexually transmitted infection) that can affect both women and men. In women, the outer area of the female genitalia (vulva) and the vagina are affected. In men, the penis is mainly affected, but the prostate and other reproductive organs can also be involved. This condition can be treated with medicine. It often has no symptoms (is asymptomatic), especially in men. What are the causes? This condition is caused by an organism called Trichomonas vaginalis. Trichomoniasis most often spreads from person to person (is contagious) through sexual contact. What increases the risk? The following factors may make you more likely to develop this condition:  Having unprotected sexual intercourse.  Having sexual intercourse with a partner who has trichomoniasis.  Having multiple sexual partners.  Having had previous trichomoniasis infections or other STIs. What are the signs or symptoms? In women, symptoms of trichomoniasis include:  Abnormal vaginal discharge that is clear, white, gray, or yellow-green and foamy and has an unusual "fishy" odor.  Itching and irritation of the vagina and vulva.  Burning or pain during urination or sexual intercourse.  Genital redness and swelling. In men, symptoms of trichomoniasis include:  Penile discharge that may be foamy or contain pus.  Pain in the penis. This may happen only when urinating.  Itching or irritation inside the penis.  Burning after urination or ejaculation. How is this diagnosed? In women, this condition may be found during a routine Pap test or physical exam. It may be found in men during a routine physical exam. Your health care provider may perform tests to help diagnose this infection, such as:  Urine tests (men and women).  The following in women: ? Testing the pH of the vagina. ? A vaginal swab test that checks for the Trichomonas vaginalis organism. ? Testing vaginal secretions. Your  health care provider may test you for other STIs, including HIV (human immunodeficiency virus). How is this treated? This condition is treated with medicine taken by mouth (orally), such as metronidazole or tinidazole to fight the infection. Your sexual partner(s) may also need to be tested and treated.  If you are a woman and you plan to become pregnant or think you may be pregnant, tell your health care provider right away. Some medicines that are used to treat the infection should not be taken during pregnancy. Your health care provider may recommend over-the-counter medicines or creams to help relieve itching or irritation. You may be tested for infection again 3 months after treatment. Follow these instructions at home:  Take and use over-the-counter and prescription medicines, including creams, only as told by your health care provider.  Do not have sexual intercourse until one week after you finish your medicine, or until your health care provider approves. Ask your health care provider when you may resume sexual intercourse.  (Women) Do not douche or wear tampons while you have the infection.  Discuss your infection with your sexual partner(s). Make sure that your partner gets tested and treated, if necessary.  Keep all follow-up visits as told by your health care provider. This is important. How is this prevented?  Use condoms every time you have sex. Using condoms correctly and consistently can help protect against STIs.  Avoid having multiple sexual partners.  Talk with your sexual partner about any symptoms that either of you may have, as well as any history of STIs.  Get tested for STIs and STDs (sexually transmitted diseases) before you have sex. Ask your partner to do the same.  Do not have sexual contact if you have symptoms of trichomoniasis or another STI. Contact a health care provider if:  You still have symptoms after you finish your medicine.  You develop pain in  your abdomen.  You have pain when you urinate.  You have bleeding after sexual intercourse.  You develop a rash.  You feel nauseous or you vomit.  You plan to become pregnant or think you may be pregnant. Summary  Trichomoniasis is an STI (sexually transmitted infection) that can affect both women and men.  This condition often has no symptoms (is asymptomatic), especially in men.  You should not have sexual intercourse until one week after you finish your medicine, or until your health care provider approves. Ask your health care provider when you may resume sexual intercourse.  Discuss your infection with your sexual partner. Make sure that your partner gets tested and treated, if necessary. This information is not intended to replace advice given to you by your health care provider. Make sure you discuss any questions you have with your health care provider. Document Released: 10/24/2000 Document Revised: 03/23/2016 Document Reviewed: 03/23/2016 Elsevier Interactive Patient Education  2019 [employer].   Threatened Miscarriage  A threatened miscarriage occurs when a woman has vaginal bleeding during the first 20 weeks of pregnancy but the pregnancy has not ended. If you have vaginal bleeding during this time, your health care provider will do tests to make sure you are still pregnant. If the tests show that you are still pregnant and that the developing baby (fetus) inside your uterus is still growing, your condition is considered a threatened miscarriage. A threatened miscarriage does not mean your pregnancy will end, but it does increase the risk of losing your pregnancy (complete miscarriage). What are the causes? The cause of this condition is usually not known. For women who go on to have a complete miscarriage, the most common cause is an abnormal number of chromosomes in the developing baby. Chromosomes are the structures inside cells that hold all of a person's genetic  material. What increases the risk? The following lifestyle factors may increase your risk of a miscarriage in early pregnancy:  Smoking.  Drinking excessive amounts of alcohol or caffeine.  Recreational drug use. The following preexisting health conditions may increase your risk of a miscarriage in early pregnancy:  Polycystic ovary syndrome.  Uterine fibroids.  Infections.  Diabetes mellitus. What are the signs or symptoms? Symptoms of this condition include:  Vaginal bleeding.  Mild abdominal pain or cramps. How is this diagnosed? If you have bleeding with or without abdominal pain before 20 weeks of pregnancy, your health care provider will do tests to check whether you are still pregnant. These will include:  Ultrasound. This test uses sound waves to create images of the inside of your uterus. This allows your health care provider to look at your developing baby and other structures, such as your placenta.  Pelvic exam. This is an internal exam of your vagina and cervix.  Measurement of your baby's heart rate.  Laboratory tests such as blood tests, urine tests, or swabs for infection You may be diagnosed with a threatened miscarriage if:  Ultrasound testing shows that you are still pregnant.  Your babys heart rate is strong.  A pelvic exam shows that the opening between your uterus and your vagina (cervix) is closed.  Blood tests confirm that you are still pregnant. How is this treated? No treatments have been shown to prevent a threatened  miscarriage from going on to a complete miscarriage. However, the right home care is important. Follow these instructions at home:  Get plenty of rest.  Do not have sex or use tampons if you have vaginal bleeding.  Do not douche.  Do not smoke or use recreational drugs.  Do not drink alcohol.  Avoid caffeine.  Keep all follow-up prenatal visits as told by your health care provider. This is important. Contact a health  care provider if:  You have light vaginal bleeding or spotting while pregnant.  You have abdominal pain or cramping.  You have a fever. Get help right away if:  You have heavy vaginal bleeding.  You have blood clots coming from your vagina.  You pass tissue from your vagina.  You leak fluid, or you have a gush of fluid from your vagina.  You have severe low back pain or abdominal cramps.  You have fever, chills, and severe abdominal pain. Summary  A threatened miscarriage occurs when a woman has vaginal bleeding during the first 20 weeks of pregnancy but the pregnancy has not ended.  The cause of a threatened miscarriage is usually not known.  Symptoms of this condition may include vaginal bleeding and mild abdominal pain or cramps.  No treatments have been shown to prevent a threatened miscarriage from going on to a complete miscarriage.  Keep all follow-up prenatal visits as told by your health care provider. This is important. This information is not intended to replace advice given to you by your health care provider. Make sure you discuss any questions you have with your health care provider. Document Released: 04/30/2005 Document Revised: 07/27/2016 Document Reviewed: 07/27/2016 Elsevier Interactive Patient Education  2019 [employer].

## 2018-10-22 NOTE — MAU Provider Note (Signed)
History     CSN: [id number]  Arrival date and time: 10/22/18 0808   First Provider Initiated Contact with Patient 10/22/18 [phone]      Chief Complaint  Patient presents with  . Vaginal Bleeding   Rhonda Hoover is a 33 y.o. [id number] at [redacted]w[redacted]d who has yet to establish prenatal care.  She presents today for Vaginal Bleeding.  She reports she had a gush of dark red blood while using the toilet.  She states she is now having light spotting similar to a period, but also dark red in color.  She denies pain or issues with urination. She reports some "white heavy" vaginal discharge that has been present since discovery of pregnancy.  She denies recent sexual activity.      OB History    Gravida  6   Para  3   Term  0   Preterm  3   AB  2   Living  3     SAB  2   TAB  0   Ectopic  0   Multiple      Live Births  3           Past Medical History:  Diagnosis Date  . Medical history non-contributory     Past Surgical History:  Procedure Laterality Date  . CERVICAL CERCLAGE     with each pregnancy  . CESAREAN SECTION     x2    Family History  Problem Relation Age of Onset  . Healthy Mother   . Healthy Father     Social History   Tobacco Use  . Smoking status: Never Smoker  . Smokeless tobacco: Never Used  Substance Use Topics  . Alcohol use: No  . Drug use: No    Allergies:  Allergies  Allergen Reactions  . Penicillins Rash    Childhood allergy    Medications Prior to Admission  Medication Sig Dispense Refill Last Dose  . ferrous sulfate 325 (65 FE) MG tablet Take 1 tablet (325 mg total) by mouth daily with breakfast. TAKE WITH ORANGE JUICE 30 tablet 0 Past Week at Unknown time  . cephALEXin (KEFLEX) 500 MG capsule Take 1 capsule (500 mg total) by mouth 4 (four) times daily. 40 capsule 0   . cyclobenzaprine (FLEXERIL) 10 MG tablet Take 1 tablet (10 mg total) by mouth 2 (two) times daily as needed for muscle spasms. 20 tablet 0   . ondansetron  (ZOFRAN) 8 MG tablet Take 1 tablet (8 mg total) by mouth every 8 (eight) hours as needed for nausea or vomiting. 10 tablet 0   . predniSONE (DELTASONE) 10 MG tablet Please take 6 tablets 4 days 1 through 5, 5 tablets on day 6, 4 tablets on day 7, 3 tablets on day 8, 2 tablets on day 9, and 1 tablet on day 10. 45 tablet 0     Review of Systems  Constitutional: Negative for chills and fever.  Respiratory: Negative for cough and shortness of breath.   Gastrointestinal: Positive for nausea. Negative for abdominal pain, constipation, diarrhea and vomiting.  Genitourinary: Positive for vaginal bleeding and vaginal discharge. Negative for difficulty urinating and dysuria.  Musculoskeletal: Negative for back pain.   Physical Exam   Blood pressure 131/76, pulse 83, temperature 98.6 F (37 C), temperature source Oral, resp. rate 19, height 5\' 7"  (1.702 m), weight 71 kg, last menstrual period 09/06/2018, SpO2 100 %, unknown if currently breastfeeding.  Physical Exam  Constitutional: She is oriented to  person, place, and time. She appears well-developed and well-nourished. No distress.  HENT:  Head: Normocephalic and atraumatic.  Eyes: Conjunctivae are normal.  Neck: Normal range of motion.  Cardiovascular: Normal rate.  Respiratory: Effort normal.  GI: Soft.  Genitourinary: Cervix exhibits no motion tenderness and no friability.    Vaginal bleeding present.  There is bleeding in the vagina.    Genitourinary Comments: Speculum Exam: -Vaginal Vault: Pink mucosa.  Moderate amt blood in vault -wet prep collected -Cervix:Large vs Edematous. Pink, no lesions, cysts, or polyps.  Appears closed. Scant amt ctive bleeding  from os-GC/CT collected -Bimanual Exam: Closed Lower Uterine Segment feels heavy-? Fibroid Difficult to assess fundus or assess uterine size.    Musculoskeletal: Normal range of motion.  Neurological: She is alert and oriented to person, place, and time.  Skin: Skin is warm and dry.   Psychiatric: She has a normal mood and affect. Her behavior is normal.    MAU Course  Procedures Results for orders placed or performed during the hospital encounter of 10/22/18 (from the past 24 hour(s))  Pregnancy, urine POC     Status: Abnormal   Collection Time: 10/22/18  8:41 AM  Result Value Ref Range   Preg Test, Ur POSITIVE (A) NEGATIVE  Urinalysis, Routine w reflex microscopic     Status: Abnormal   Collection Time: 10/22/18  8:59 AM  Result Value Ref Range   Color, Urine STRAW (A) YELLOW   APPearance CLEAR CLEAR   Specific Gravity, Urine 1.005 1.005 - 1.030   pH 7.0 5.0 - 8.0   Glucose, UA NEGATIVE NEGATIVE mg/dL   Hgb urine dipstick LARGE (A) NEGATIVE   Bilirubin Urine NEGATIVE NEGATIVE   Ketones, ur NEGATIVE NEGATIVE mg/dL   Protein, ur NEGATIVE NEGATIVE mg/dL   Nitrite NEGATIVE NEGATIVE   Leukocytes,Ua SMALL (A) NEGATIVE   RBC / HPF >50 (H) 0 - 5 RBC/hpf   WBC, UA 6-10 0 - 5 WBC/hpf   Bacteria, UA RARE (A) NONE SEEN   Squamous Epithelial / LPF 0-5 0 - 5   Trichomonas, UA PRESENT (A) NONE SEEN  Wet prep, genital     Status: Abnormal   Collection Time: 10/22/18  9:13 AM  Result Value Ref Range   Yeast Wet Prep HPF POC NONE SEEN NONE SEEN   Trich, Wet Prep PRESENT (A) NONE SEEN   Clue Cells Wet Prep HPF POC NONE SEEN NONE SEEN   WBC, Wet Prep HPF POC MANY (A) NONE SEEN   Sperm NONE SEEN   Type and screen     Status: None   Collection Time: 10/22/18  9:43 AM  Result Value Ref Range   ABO/RH(D) B POS    Antibody Screen NEG    Sample Expiration      10/25/2018,2359 Performed at [facility], [address] [place], [place] [address]   CBC     Status: Abnormal   Collection Time: 10/22/18  9:43 AM  Result Value Ref Range   WBC 5.9 4.0 - 10.5 K/uL   RBC 4.78 3.87 - 5.11 MIL/uL   Hemoglobin 8.0 (L) 12.0 - 15.0 g/dL   HCT 29.8 (L) 36.0 - 46.0 %   MCV 62.3 (L) 80.0 - 100.0 fL   MCH 16.7 (L) 26.0 - 34.0 pg   MCHC 26.8 (L) 30.0 - 36.0 g/dL   RDW 22.8  (H) 11.5 - 15.5 %   Platelets 333 150 - 400 K/uL   nRBC 0.0 0.0 - 0.2 %  US Ob Less Than 14 Weeks With Ob Transvaginal  Result Date: 10/22/2018 CLINICAL DATA:  Vaginal bleeding in 1st trimester pregnancy. EXAM: OBSTETRIC <14 WK [place] AND TRANSVAGINAL OB US TECHNIQUE: Both transabdominal and transvaginal ultrasound examinations were performed for complete evaluation of the gestation as well as the maternal uterus, adnexal regions, and pelvic cul-de-sac. Transvaginal technique was performed to assess early pregnancy. COMPARISON:  None. FINDINGS: Intrauterine gestational sac: Single, irregular shape and located in lower uterine segment Yolk sac:  Visualized. Embryo:  Visualized. Cardiac Activity: Visualized. Heart Rate: 143 bpm CRL:  11 mm   7 w   1 d                  [place] EDC: 06/09/2019 Subchorionic hemorrhage: Small subchorionic hemorrhage seen superior to the gestational sac Maternal uterus/adnexae: Retroflexed uterus calcified fibroid seen in left anterior lower uterine segment measuring 4.5 cm. Left ovarian corpus luteum cyst noted. Normal appearance of right ovary. No adnexal mass or abnormal free fluid identified. IMPRESSION: Single living IUP measuring 7 weeks 1 day, which is located in the lower uterine segment, and is suspicious for impending spontaneous abortion. Retroflexed uterus, with 4.5 cm calcified fibroid in left anterior lower uterine segment. No adnexal mass or abnormal free fluid identified. Electronically Signed   By: Earle Gell M.D.   On: 10/22/2018 10:35    MDM Pelvic Exam with cultures-Wet prep and GC/CT Labs: UA, CBC, hCG, and T&S TVUS Assessment and Plan  33 year old  [id number] at 6.4 weeks by LMP Vaginal Bleeding  -Pelvic Exam completed. -Exam findings discussed. -Informed of further testing to include: *Labs with identification of blood type and Rhogam shot if necessary. *Ultrasound to evaluate uterus and uterine contents. -Will await results  Follow Up (11:12  AM) Trichomonas IUP in LUS-Threatened Abortion  -Wet prep returns significant for trich. -Results discussed with patient. -Offered and accepts Rx for partner; Alycia Patten 02/03/1989 give Expedited Partner Treatment. -Patient ordered Flagyl 2g now. -Discussed US findings with dates c/w LMP. -Informed that Glenvil noted in LUS which is suggestive of threatened abortion. -Also informed of fibroids. -Patient without questions or concerns. -Will await T&S   Follow Up (11:44 AM) B Positive  -Positive Rh status. Patient informed of blood type. -Bleeding precautions given. -Encouraged to call or return to MAU if symptoms worsen or with the onset of new symptoms. -Discharged to home in stable condition  Maryann Conners MSN, CNM 10/22/2018, 9:08 AM

## 2018-10-23 LAB — ABO/RH: ABO/RH(D): B POS

## 2018-10-23 LAB — GC/CHLAMYDIA PROBE AMP (~~LOC~~) NOT AT ARMC
Chlamydia: NEGATIVE
Neisseria Gonorrhea: NEGATIVE

## 2018-12-29 ENCOUNTER — Encounter (HOSPITAL_COMMUNITY): Payer: Self-pay

## 2018-12-29 ENCOUNTER — Ambulatory Visit (HOSPITAL_COMMUNITY): Payer: Medicaid Other

## 2019-01-20 ENCOUNTER — Inpatient Hospital Stay (HOSPITAL_COMMUNITY)
Admission: AD | Admit: 2019-01-20 | Discharge: 2019-01-20 | Disposition: A | Discharge status: Home / Self Care | Payer: Medicaid Other | Attending: Obstetrics and Gynecology | Admitting: Obstetrics and Gynecology

## 2019-01-20 ENCOUNTER — Other Ambulatory Visit: Payer: Self-pay

## 2019-01-20 ENCOUNTER — Inpatient Hospital Stay (HOSPITAL_COMMUNITY): Payer: Medicaid Other

## 2019-01-20 ENCOUNTER — Encounter (HOSPITAL_COMMUNITY): Payer: Self-pay | Admitting: Family Medicine

## 2019-01-20 DIAGNOSIS — N949 Unspecified condition associated with female genital organs and menstrual cycle: Secondary | ICD-10-CM

## 2019-01-20 DIAGNOSIS — O26892 Other specified pregnancy related conditions, second trimester: Secondary | ICD-10-CM | POA: Diagnosis not present

## 2019-01-20 DIAGNOSIS — O23592 Infection of other part of genital tract in pregnancy, second trimester: Secondary | ICD-10-CM | POA: Diagnosis not present

## 2019-01-20 DIAGNOSIS — O09892 Supervision of other high risk pregnancies, second trimester: Secondary | ICD-10-CM

## 2019-01-20 DIAGNOSIS — O26899 Other specified pregnancy related conditions, unspecified trimester: Secondary | ICD-10-CM

## 2019-01-20 DIAGNOSIS — Z3A19 19 weeks gestation of pregnancy: Secondary | ICD-10-CM

## 2019-01-20 DIAGNOSIS — O09212 Supervision of pregnancy with history of pre-term labor, second trimester: Secondary | ICD-10-CM | POA: Diagnosis not present

## 2019-01-20 DIAGNOSIS — A5901 Trichomonal vulvovaginitis: Secondary | ICD-10-CM

## 2019-01-20 DIAGNOSIS — Z9889 Other specified postprocedural states: Secondary | ICD-10-CM

## 2019-01-20 DIAGNOSIS — O98312 Other infections with a predominantly sexual mode of transmission complicating pregnancy, second trimester: Secondary | ICD-10-CM | POA: Diagnosis present

## 2019-01-20 DIAGNOSIS — R102 Pelvic and perineal pain: Secondary | ICD-10-CM | POA: Diagnosis not present

## 2019-01-20 DIAGNOSIS — O09292 Supervision of pregnancy with other poor reproductive or obstetric history, second trimester: Secondary | ICD-10-CM

## 2019-01-20 LAB — URINALYSIS, ROUTINE W REFLEX MICROSCOPIC
Bilirubin Urine: NEGATIVE
Glucose, UA: NEGATIVE mg/dL
Hgb urine dipstick: NEGATIVE
Ketones, ur: NEGATIVE mg/dL
Nitrite: NEGATIVE
Protein, ur: NEGATIVE mg/dL
Specific Gravity, Urine: 1.011 (ref 1.005–1.030)
pH: 7 (ref 5.0–8.0)

## 2019-01-20 LAB — WET PREP, GENITAL
Clue Cells Wet Prep HPF POC: NONE SEEN
Sperm: NONE SEEN
Yeast Wet Prep HPF POC: NONE SEEN

## 2019-01-20 MED ORDER — PROGESTERONE MICRONIZED 200 MG PO CAPS: 200.0000 mg | ORAL_CAPSULE | Freq: Every day | ORAL | 0 refills | Status: AC

## 2019-01-20 MED ORDER — METRONIDAZOLE 500 MG PO TABS: 500.0000 mg | ORAL_TABLET | Freq: Two times a day (BID) | ORAL | 0 refills | Status: DC

## 2019-01-20 MED ORDER — PROGESTERONE MICRONIZED 100 MG PO CAPS
200.0000 mg | ORAL_CAPSULE | Freq: Once | ORAL | Status: AC
  Administered 2019-01-20: 200 mg via VAGINAL
  Filled 2019-01-20: qty 2

## 2019-01-20 NOTE — MAU Provider Note (Signed)
Chief Complaint: Abdominal Pain   First Provider Initiated Contact with Patient 01/20/19 2131      SUBJECTIVE HPI: Rhonda Hoover is a 33 y.o. [id number] at [redacted]w[redacted]d who presents to maternity admissions reporting pelvic pressure. Patient with hx significant for incompetent cervix (cerclage x2) and two preterm deliveries (31 and 35 weeks) along with two spontaneous abortions (16 and 20 weeks). Reports that this pregnancy has been going well. Last [place] around 16 weeks and she was told everything was normal including placenta and cervical length. She was told cervical length was "very long" around 6 cm.  She denies vaginal bleeding, abdominal pain, vaginal itching/burning, urinary symptoms, h/a, dizziness, n/v, or fever/chills.    Past Medical History:  Diagnosis Date  . Medical history non-contributory    Past Surgical History:  Procedure Laterality Date  . CERVICAL CERCLAGE     with each pregnancy  . CESAREAN SECTION     x2   Social History   Socioeconomic History  . Marital status: Legally Separated    Spouse name: Not on file  . Number of children: Not on file  . Years of education: Not on file  . Highest education level: Not on file  Occupational History  . Not on file  Social Needs  . Financial resource strain: Not on file  . Food insecurity    Worry: Not on file    Inability: Not on file  . Transportation needs    Medical: Not on file    Non-medical: Not on file  Tobacco Use  . Smoking status: Never Smoker  . Smokeless tobacco: Never Used  Substance and Sexual Activity  . Alcohol use: No  . Drug use: No  . Sexual activity: Yes  Lifestyle  . Physical activity    Days per week: Not on file    Minutes per session: Not on file  . Stress: Not on file  Relationships  . Social Herbalist on phone: Not on file    Gets together: Not on file    Attends religious service: Not on file    Active member of club or organization: Not on file    Attends meetings of clubs or  organizations: Not on file    Relationship status: Not on file  . Intimate partner violence    Fear of current or ex partner: Not on file    Emotionally abused: Not on file    Physically abused: Not on file    Forced sexual activity: Not on file  Other Topics Concern  . Not on file  Social History Narrative  . Not on file   No current facility-administered medications on file prior to encounter.    Current Outpatient Medications on File Prior to Encounter  Medication Sig Dispense Refill  . OVER THE COUNTER MEDICATION Probiotic with cranberry daily    . Prenatal Vit-Fe Fumarate-FA (PRENATAL MULTIVITAMIN) TABS tablet Take 1 tablet by mouth daily at 12 noon.     Allergies  Allergen Reactions  . Penicillins Rash    Childhood allergy    ROS:  Review of Systems All other systems negative unless noted above in HPI.   I have reviewed patient's Past Medical Hx, Surgical Hx, Family Hx, Social Hx, medications and allergies.   Physical Exam   Patient Vitals for the past 24 hrs:  BP Temp Temp src Pulse Resp SpO2 Height Weight  01/20/19 2130 129/62 98.5 F (36.9 C) Oral 87 - 100 % - -  01/20/19  2116 112/60 98.8 F (37.1 C) - 88 20 - 5' 7.5" (1.715 m) 78.3 kg   Constitutional: Well-developed, well-nourished female in no acute distress.  Cardiovascular: normal rate Respiratory: normal effort GI: Abd soft, non-tender.  MS: Extremities nontender, no edema, normal ROM Neurologic: Alert and oriented x 4.  PELVIC EXAM: Unable to clearly visualize cervix, possibly very anteverted; no significant vaginal discharge, vaginal walls and external genitalia normal Bimanual exam: Difficult exam and subsequently examined by myself, RN and CNM Hansel Feinstein. Consensus is that cervix is effaced but feels closed and also very low in pelvis.  FHT 155 by doppler  LAB RESULTS Results for orders placed or performed during the hospital encounter of 01/20/19 (from the past 24 hour(s))  Urinalysis,  Routine w reflex microscopic     Status: Abnormal   Collection Time: 01/20/19  9:38 PM  Result Value Ref Range   Color, Urine YELLOW YELLOW   APPearance HAZY (A) CLEAR   Specific Gravity, Urine 1.011 1.005 - 1.030   pH 7.0 5.0 - 8.0   Glucose, UA NEGATIVE NEGATIVE mg/dL   Hgb urine dipstick NEGATIVE NEGATIVE   Bilirubin Urine NEGATIVE NEGATIVE   Ketones, ur NEGATIVE NEGATIVE mg/dL   Protein, ur NEGATIVE NEGATIVE mg/dL   Nitrite NEGATIVE NEGATIVE   Leukocytes,Ua LARGE (A) NEGATIVE   RBC / HPF 11-20 0 - 5 RBC/hpf   WBC, UA 21-50 0 - 5 WBC/hpf   Bacteria, UA RARE (A) NONE SEEN   Squamous Epithelial / LPF 11-20 0 - 5   Mucus PRESENT   Wet prep, genital     Status: Abnormal   Collection Time: 01/20/19 10:00 PM   Specimen: Cervical/Vaginal swab; Genital  Result Value Ref Range   Yeast Wet Prep HPF POC NONE SEEN NONE SEEN   Trich, Wet Prep PRESENT (A) NONE SEEN   Clue Cells Wet Prep HPF POC NONE SEEN NONE SEEN   WBC, Wet Prep HPF POC MANY (A) NONE SEEN   Sperm NONE SEEN     --/--/B POS, B POS (06/10 [id number])  IMAGING US Ob Limited  Result Date: 01/20/2019 CLINICAL DATA:  Pelvic pain EXAM: LIMITED OBSTETRIC ULTRASOUND AND TRANSVAGINAL OBSTETRIC ULTRASOUND FINDINGS: Number of Fetuses: 1 Heart Rate:  164 bpm Movement: Visualized Presentation: Cephalic Placental Location: Anterior Previa: No Amniotic Fluid (Subjective): Within normal limits. AFI: 7.3 cm BPD:  cm w  d MATERNAL FINDINGS: Cervix: 2.6 cm with funneling at the internal os. Debris/clot noted at the internal os. Uterus/Adnexae:  No abnormality visualized. IMPRESSION: Cervical length at the internal os 2.6 cm with funneling. Debris/clot noted at the internal os. This exam is performed on an emergent basis and does not comprehensively evaluate fetal size, dating, or anatomy; follow-up complete OB US should be considered if further fetal assessment is warranted. Electronically Signed   By: Rolm Baptise M.D.   On: 01/20/2019 22:50   US Ob  Transvaginal  Result Date: 01/20/2019 CLINICAL DATA:  Pelvic pain EXAM: LIMITED OBSTETRIC ULTRASOUND AND TRANSVAGINAL OBSTETRIC ULTRASOUND FINDINGS: Number of Fetuses: 1 Heart Rate:  164 bpm Movement: Visualized Presentation: Cephalic Placental Location: Anterior Previa: No Amniotic Fluid (Subjective): Within normal limits. AFI: 7.3 cm BPD:  cm w  d MATERNAL FINDINGS: Cervix: 2.6 cm with funneling at the internal os. Debris/clot noted at the internal os. Uterus/Adnexae:  No abnormality visualized. IMPRESSION: Cervical length at the internal os 2.6 cm with funneling. Debris/clot noted at the internal os. This exam is performed on an emergent basis and does not  comprehensively evaluate fetal size, dating, or anatomy; follow-up complete OB US should be considered if further fetal assessment is warranted. Electronically Signed   By: Rolm Baptise M.D.   On: 01/20/2019 22:50    MAU Management/MDM: Orders Placed This Encounter  Procedures  . Wet prep, genital  . Culture, Urine  . US OB Limited  . US OB Transvaginal  . Urinalysis, Routine w reflex microscopic  . Discharge patient Discharge disposition: 01-Home or Self Care; Discharge patient date: 01/20/2019    Meds ordered this encounter  Medications  . progesterone (PROMETRIUM) capsule 200 mg  . progesterone (PROMETRIUM) 200 MG capsule    Sig: Place 1 capsule (200 mg total) vaginally daily.    Dispense:  60 capsule    Refill:  0  . metroNIDAZOLE (FLAGYL) 500 MG tablet    Sig: Take 1 tablet (500 mg total) by mouth 2 (two) times daily.    Dispense:  14 tablet    Refill:  0    Patient presented with pelvic pressure in 2nd trimester w/ hx of preterm delivery, cerclage x2 and 2 late miscarriages. She has not been on Prometrium or had a cerclage placed this pregnancy as she was told everything was going well. Patient with cervical effacement by exam. TVUS obtained with CL ~ 2.6 cm with funneling and debris/clot at internal os. Cervix closed. D/w Dr.  Kennon Rounds. Patient given Prometrium and prescribed on discharge. Instructed to have pelvic rest. Also noted to have Trich on wet prep. Patient denies recent sexual intercourse and being treated for this 2x recently. Metronidazole prescribed on discharge. Large leuks on UA but asymptomatic. Culture ordered. Pt discharged with strict return precautions. She was instructed to call OB clinic in the morning to make an appointment for further evaluation and possible arrangement of cerclage placement. Patient currently trying to transfer care from [facility] group as she has not been happy with care. She was given names of 3 OB clinics to call.   ASSESSMENT 1. Pelvic pressure in pregnancy, second trimester   2. Pelvic pressure in pregnancy   3. History of preterm delivery, currently pregnant in second trimester   4. Trichomonal vaginitis in pregnancy in second trimester   5. History of cerclage, currently pregnant, second trimester     PLAN Discharge home  Allergies as of 01/20/2019      Reactions   Penicillins Rash   Childhood allergy      Medication List    TAKE these medications   metroNIDAZOLE 500 MG tablet Commonly known as: FLAGYL Take 1 tablet (500 mg total) by mouth 2 (two) times daily.   OVER THE COUNTER MEDICATION Probiotic with cranberry daily   prenatal multivitamin Tabs tablet Take 1 tablet by mouth daily at 12 noon.   progesterone 200 MG capsule Commonly known as: PROMETRIUM Place 1 capsule (200 mg total) vaginally daily.      Follow-up [place] for [facility]. Schedule an appointment as soon as possible for a visit in 1 day(s).   Specialty: Obstetrics and Gynecology Contact information: [address], [place] [id number] [place] [phone] [place] .   Specialty: Obstetrics and Gynecology Contact information: [address] [place] [phone]       Obgyn, Erling Conte .   Contact information: [address] [place] [address] [phone]  Barrington Ellison, MD [facility] Family Medicine Fellow, [facility] for [facility] [place] [facility] 01/20/2019  10:56 PM

## 2019-01-20 NOTE — MAU Note (Signed)
PT CAME FROM  [place]-  AFTER SUPPER FELT CRAMPS  THEN PRESSURE . PNC WITH [facility]- IN  [place]-  GOING TO SWITCH TO  [address]  NO PROBLEMS  WITH PREG.  LAST SEX-  AT 5 WEEKS .

## 2019-01-21 ENCOUNTER — Encounter (HOSPITAL_COMMUNITY): Payer: Self-pay

## 2019-01-21 ENCOUNTER — Inpatient Hospital Stay (HOSPITAL_COMMUNITY)
Admission: AD | Admit: 2019-01-21 | Discharge: 2019-01-21 | Disposition: A | Discharge status: Home / Self Care | Payer: Medicaid Other | Attending: Obstetrics and Gynecology | Admitting: Obstetrics and Gynecology

## 2019-01-21 ENCOUNTER — Inpatient Hospital Stay (HOSPITAL_BASED_OUTPATIENT_CLINIC_OR_DEPARTMENT_OTHER): Payer: Medicaid Other

## 2019-01-21 DIAGNOSIS — O26892 Other specified pregnancy related conditions, second trimester: Secondary | ICD-10-CM

## 2019-01-21 DIAGNOSIS — Z3686 Encounter for antenatal screening for cervical length: Secondary | ICD-10-CM

## 2019-01-21 DIAGNOSIS — R102 Pelvic and perineal pain: Secondary | ICD-10-CM | POA: Diagnosis not present

## 2019-01-21 DIAGNOSIS — Z3A19 19 weeks gestation of pregnancy: Secondary | ICD-10-CM

## 2019-01-21 DIAGNOSIS — Z9889 Other specified postprocedural states: Secondary | ICD-10-CM | POA: Diagnosis not present

## 2019-01-21 DIAGNOSIS — O09899 Supervision of other high risk pregnancies, unspecified trimester: Secondary | ICD-10-CM

## 2019-01-21 DIAGNOSIS — O09212 Supervision of pregnancy with history of pre-term labor, second trimester: Secondary | ICD-10-CM | POA: Diagnosis not present

## 2019-01-21 DIAGNOSIS — O34219 Maternal care for unspecified type scar from previous cesarean delivery: Secondary | ICD-10-CM

## 2019-01-21 DIAGNOSIS — O26872 Cervical shortening, second trimester: Secondary | ICD-10-CM | POA: Insufficient documentation

## 2019-01-21 DIAGNOSIS — O26899 Other specified pregnancy related conditions, unspecified trimester: Secondary | ICD-10-CM

## 2019-01-21 DIAGNOSIS — O09292 Supervision of pregnancy with other poor reproductive or obstetric history, second trimester: Secondary | ICD-10-CM | POA: Diagnosis not present

## 2019-01-21 DIAGNOSIS — O09299 Supervision of pregnancy with other poor reproductive or obstetric history, unspecified trimester: Secondary | ICD-10-CM

## 2019-01-21 DIAGNOSIS — Z79899 Other long term (current) drug therapy: Secondary | ICD-10-CM | POA: Insufficient documentation

## 2019-01-21 DIAGNOSIS — M545 Low back pain: Secondary | ICD-10-CM | POA: Diagnosis not present

## 2019-01-21 LAB — URINALYSIS, ROUTINE W REFLEX MICROSCOPIC
Bilirubin Urine: NEGATIVE
Glucose, UA: NEGATIVE mg/dL
Hgb urine dipstick: NEGATIVE
Ketones, ur: NEGATIVE mg/dL
Nitrite: NEGATIVE
Protein, ur: 30 mg/dL — AB
Specific Gravity, Urine: 1.017 (ref 1.005–1.030)
WBC, UA: >50 WBC/hpf — ABNORMAL HIGH (ref 0–5)
pH: 8 (ref 5.0–8.0)

## 2019-01-21 NOTE — MAU Provider Note (Signed)
History     CSN: [id number]  Arrival date and time: 01/21/19 1332   None     Chief Complaint  Patient presents with  . Pelvic Pain   HPI   Rhonda Hoover is a 33 y.o. [id number] female at [redacted]w[redacted]d who presents for increased pelvic pressure/pain and new onset low back pain (in tailbone region). Patient has a history significant for two second trimester SABs (at 45 and [redacted] weeks GA) followed by 3 vaginal deliveries with use of cerclage at 37, 35, and 31 weeks. Patient was seen in MAU yesterday. Found to have closed but very effaced cervix on exam. Ultrasound performed that revealed 2.6 cm cervix with funneling, closed os. Progesterone was prescribed at MAU visit yesterday after results received. Patient is a [facility] patient but wishes to transfer. Kerry Hough helping to coordinator scheduling patient within the week at [facility] to develop a plan of care with OB-GYN. Patient presented today as symptoms had worsened in severity and back pain was new onset. She denies vaginal bleeding, vaginal discharge, and LOF.   OB History    Gravida  6   Para  3   Term  1   Preterm  2   AB  2   Living  3     SAB  2   TAB  0   Ectopic  0   Multiple      Live Births  3           Past Medical History:  Diagnosis Date  . Medical history non-contributory     Past Surgical History:  Procedure Laterality Date  . CERVICAL CERCLAGE     with each pregnancy  . CESAREAN SECTION     x2    Family History  Problem Relation Age of Onset  . Healthy Mother   . Healthy Father     Social History   Tobacco Use  . Smoking status: Never Smoker  . Smokeless tobacco: Never Used  Substance Use Topics  . Alcohol use: No  . Drug use: No    Allergies:  Allergies  Allergen Reactions  . Penicillins Rash    Childhood allergy    Medications Prior to Admission  Medication Sig Dispense Refill Last Dose  . metroNIDAZOLE (FLAGYL) 500 MG tablet Take 1 tablet (500 mg total) by mouth 2 (two) times daily.  14 tablet 0   . OVER THE COUNTER MEDICATION Probiotic with cranberry daily     . Prenatal Vit-Fe Fumarate-FA (PRENATAL MULTIVITAMIN) TABS tablet Take 1 tablet by mouth daily at 12 noon.     . progesterone (PROMETRIUM) 200 MG capsule Place 1 capsule (200 mg total) vaginally daily. 60 capsule 0     Review of Systems  Constitutional: Negative for activity change, appetite change, chills and fever.  Respiratory: Negative for cough and shortness of breath.   Gastrointestinal: Negative for nausea and vomiting.  Genitourinary: Positive for pelvic pain. Negative for dysuria, flank pain, urgency and vaginal discharge.  Musculoskeletal: Positive for back pain.  Neurological: Negative for weakness and headaches.   Physical Exam   Blood pressure (!) 105/55, pulse 89, temperature 98.8 F (37.1 C), resp. rate 16, weight 76.7 kg, last menstrual period 09/06/2018, SpO2 100 %, unknown if currently breastfeeding.  Physical Exam  Constitutional: She is oriented to person, place, and time. She appears well-developed and well-nourished. No distress.  HENT:  Head: Normocephalic and atraumatic.  Eyes: Conjunctivae and EOM are normal.  Neck: Normal range of motion. Neck supple.  Cardiovascular: Normal rate and regular rhythm.  Respiratory: Effort normal and breath sounds normal. No respiratory distress.  GI: Soft. Bowel sounds are normal. She exhibits no distension. There is no abdominal tenderness. There is no rebound and no guarding.  Genitourinary:    Genitourinary Comments: SSE exam performed. Cervix very low in vaginal vault and appears visually closed. No bleeding or discharge present. On digital exam, cervix feels very effaced but no dilation appeared.    Musculoskeletal: Normal range of motion.  Neurological: She is alert and oriented to person, place, and time.  Skin: Skin is warm and dry. She is not diaphoretic.  Psychiatric: She has a normal mood and affect. Her behavior is normal.    FHR:  156bpm   MAU Course  Procedures  MDM Discussed plan of care with Dr. Rosana Hoes. Given cervical length of 2.6 cm with increased pelvic pressure, will obtain transvaginal ultrasound to reassess. Dr. Rosana Hoes to discuss with MFM regarding timing/possibility of rescue cerclage.   Preliminary transvaginal sono shows 2.6 cm cervical length, suboptimally seen. AFI within normal limits. FHR 152.    Assessment and Plan   1. Pelvic pressure in pregnancy   2. Short cervical length during pregnancy in second trimester   3. History of cerclage, currently pregnant   4. History of preterm delivery, currently pregnant   5. [redacted] weeks gestation of pregnancy    Per Dr. Rosana Hoes, discussed patient with Dr. Gertie Exon (MFM). Patient stable for discharge home as exam and ultrasound results are unchanged from prior. Appointment scheduled on 9/11 to establish care at [facility] and then will have consult immediately after with MFM to discuss placement of rescue cerclage. Strict interim return precautions discussed with patient. Encouraged compliance with Progesterone.    Melina Schools 01/21/2019, 3:13 PM

## 2019-01-21 NOTE — Discharge Instructions (Signed)
Your ultrasound today did not show any change in your cervix. The MFM doctor, Dr. Gertie Exon, reviewed your chart and ultrasound and decided it was safe for you to be discharged home today. You will meet with MFM on Friday, after your appointment to establish care for this pregnancy, to discuss/schedule your cerclage.   In the meantime, return to MAU for significant increase in pelvic pain/pressure, vaginal bleeding, leaking of fluid, abnormal vaginal discharge.

## 2019-01-21 NOTE — MAU Note (Signed)
.   Rhonda Hoover is a 33 y.o. at [redacted]w[redacted]d here in MAU reporting worsening pelvic pressure, pt was evaluated last night in MAU and was told she was going to be referred for cerclage, states she was told her cervix was 2.6. Had 2 previous losses 16 and 20 weeks with cerclages with other pregnancy 31, 35, and 37 weeks  Onset of complaint: ongoing with pregnancy Pain score: 5 Vitals:   01/21/19 1347 01/21/19 1348  BP: 131/72   Pulse: (!) 101   Resp: 16   Temp: 98.8 F (37.1 C)   SpO2:  100%     FHT:156 Lab orders placed from triage: UA

## 2019-01-22 LAB — URINE CULTURE: Culture: NO GROWTH

## 2019-01-22 LAB — GC/CHLAMYDIA PROBE AMP (~~LOC~~) NOT AT ARMC
Chlamydia: NEGATIVE
Neisseria Gonorrhea: NEGATIVE

## 2019-01-23 ENCOUNTER — Ambulatory Visit (HOSPITAL_COMMUNITY): Payer: Medicaid Other

## 2019-01-23 ENCOUNTER — Encounter: Payer: Self-pay | Admitting: Obstetrics & Gynecology

## 2019-01-23 ENCOUNTER — Encounter (HOSPITAL_COMMUNITY): Payer: Self-pay

## 2019-01-23 ENCOUNTER — Telehealth (HOSPITAL_COMMUNITY): Payer: Self-pay | Admitting: *Deleted

## 2019-01-23 ENCOUNTER — Ambulatory Visit (INDEPENDENT_AMBULATORY_CARE_PROVIDER_SITE_OTHER): Payer: Medicaid Other | Admitting: Obstetrics & Gynecology

## 2019-01-23 ENCOUNTER — Other Ambulatory Visit: Payer: Self-pay

## 2019-01-23 ENCOUNTER — Other Ambulatory Visit (HOSPITAL_COMMUNITY)
Admission: RE | Admit: 2019-01-23 | Discharge: 2019-01-23 | Disposition: A | Discharge status: Home / Self Care | Payer: Medicaid Other | Source: Ambulatory Visit | Attending: Obstetrics and Gynecology | Admitting: Obstetrics and Gynecology

## 2019-01-23 VITALS — BP 128/81 | HR 116 | Wt 170.0 lb

## 2019-01-23 DIAGNOSIS — Z01812 Encounter for preprocedural laboratory examination: Secondary | ICD-10-CM | POA: Insufficient documentation

## 2019-01-23 DIAGNOSIS — Z20828 Contact with and (suspected) exposure to other viral communicable diseases: Secondary | ICD-10-CM | POA: Diagnosis not present

## 2019-01-23 DIAGNOSIS — O26899 Other specified pregnancy related conditions, unspecified trimester: Secondary | ICD-10-CM

## 2019-01-23 DIAGNOSIS — O09292 Supervision of pregnancy with other poor reproductive or obstetric history, second trimester: Secondary | ICD-10-CM

## 2019-01-23 DIAGNOSIS — O0992 Supervision of high risk pregnancy, unspecified, second trimester: Secondary | ICD-10-CM

## 2019-01-23 DIAGNOSIS — O09212 Supervision of pregnancy with history of pre-term labor, second trimester: Secondary | ICD-10-CM

## 2019-01-23 DIAGNOSIS — O26892 Other specified pregnancy related conditions, second trimester: Secondary | ICD-10-CM

## 2019-01-23 DIAGNOSIS — Z9889 Other specified postprocedural states: Secondary | ICD-10-CM | POA: Insufficient documentation

## 2019-01-23 DIAGNOSIS — O0990 Supervision of high risk pregnancy, unspecified, unspecified trimester: Secondary | ICD-10-CM | POA: Insufficient documentation

## 2019-01-23 DIAGNOSIS — O09899 Supervision of other high risk pregnancies, unspecified trimester: Secondary | ICD-10-CM | POA: Insufficient documentation

## 2019-01-23 DIAGNOSIS — O26872 Cervical shortening, second trimester: Secondary | ICD-10-CM

## 2019-01-23 DIAGNOSIS — R102 Pelvic and perineal pain: Secondary | ICD-10-CM

## 2019-01-23 DIAGNOSIS — Z3A19 19 weeks gestation of pregnancy: Secondary | ICD-10-CM

## 2019-01-23 LAB — SARS CORONAVIRUS 2 (TAT 6-24 HRS): SARS Coronavirus 2: NEGATIVE

## 2019-01-23 MED ORDER — INDOMETHACIN 50 MG RE SUPP: 100.0000 mg | Freq: Once | RECTAL | Status: DC

## 2019-01-23 NOTE — Patient Instructions (Signed)
Cervical Cerclage  Cervical cerclage is a surgical procedure to correct a cervix that opens up and thins out before pregnancy is at term (cervical insufficiency, also called incompetent cervix). This condition can cause labor to start early (prematurely). This procedure involves using stitches to sew the cervix shut during pregnancy. Your surgeon may use ultrasound equipment to help guide the procedure and monitor your baby. Ultrasound equipment uses sound waves to take images of your cervix and uterus. Your surgeon will assess these images on a monitor in the operating room. Tell a health care provider about:  Any allergies you have, especially any allergies related to prescribed medicine, stitches, or anesthetic medicines.  All medicines you are taking, including vitamins, herbs, eye drops, creams, and over-the-counter medicines. Bring a list of all of your medicines to your appointment.  Your medical history, including prior labor deliveries.  Any problems you or family members have had with anesthetic medicines.  Any blood disorders you have.  Any surgeries you have had, including prior cervical stitching.  Any medical conditions you have.  Whether you are pregnant or may be pregnant. What are the risks? Generally, this is a safe procedure. However, problems may occur, including:  Infection, such as infection of the cervix or amniotic sac.  Vaginal bleeding.  Allergic reactions to medicines.  Damage to other structures or organs, such as tearing (rupture) of membranes or cervical laceration.  Premature contractions including going into early labor and delivery.  Cervical dystocia, which occurs when the cervix is unable to dilate normally during labor. What happens before the procedure? Staying hydrated Follow instructions from your health care provider about hydration, which may include:  Up to 2 hours before the procedure - you may continue to drink clear liquids, such as  water, clear fruit juice, black coffee, and plain tea. Eating and drinking restrictions Follow instructions from your health care provider about eating and drinking, which may include:  8 hours before the procedure - stop eating heavy meals or foods such as meat, fried foods, or fatty foods.  6 hours before the procedure - stop eating light meals or foods, such as toast or cereal.  6 hours before the procedure - stop drinking milk or drinks that contain milk.  2 hours before the procedure - stop drinking clear liquids.                     Medicines  Ask your health care provider about: ? Changing or stopping your regular medicines. This is especially important if you are taking diabetes medicines or blood thinners. ? Taking medicines such as aspirin and ibuprofen. These medicines can thin your blood. Do not take these medicines before your procedure if your health care provider instructs you not to.  You may be given antibiotic medicine to help prevent infection. General instructions  Do not put on any lotion, deodorant, or perfume.  Remove contact lenses and jewelry.  Ask your health care provider how your surgical site will be marked or identified.  You may have an exam or testing.  You may have a blood or urine sample taken.  Plan to have someone take you home from the hospital or clinic.  If you will be going home right after the procedure, plan to have someone with you for 24 hours. What happens during the procedure?  To reduce your risk of infection: ? Your health care team will wash or sanitize their hands. ? Your skin will be washed with  soap.  An IV tube will be inserted into one of your veins.  You may be given one or more of the following: ? A medicine to help you relax (sedative). ? A medicine to numb the area (local anesthetic). ? A medicine to make you fall asleep (general anesthetic). ? A medicine that is injected into your spine to numb  the area below and slightly above the injection site (spinal anesthetic). ? A medicine that is injected into an area of your body to numb everything below the injection site (regional anesthetic).  A lubricated instrument (speculum) will be inserted into your vagina. The speculum will be widened to open the walls of your vagina so your surgeon can see your cervix.  Your cervix will be grasped and tightly stitched closed (sutured). To do this, your surgeon will stitch a strong band of thread around your cervix, then the thread will be tightened to hold your cervix shut. The procedure may vary among health care providers and hospitals. What happens after the procedure?  Your blood pressure, heart rate, breathing rate, and blood oxygen level will be monitored until the medicines you were given have worn off. You will be monitored for premature contractions.  You may have light bleeding and mild cramping.  You may have to wear compression stockings. These stockings help to prevent blood clots and reduce swelling in your legs.  Do not drive for 24 hours if you received a sedative.  You may be put on bed rest.  You may be given medicine to prevent infection.  You may be given an injection of a hormone (progesterone) to prevent your uterus from tightening (contracting). Summary  Cervical cerclage is a surgical procedure that involves using stitches to sew the cervix shut during pregnancy.  Your blood pressure, heart rate, breathing rate, and blood oxygen level will be monitored until the medicines you were given have worn off. You will be monitored for premature contractions.  You may need to be on bed rest after the procedure.  Plan to have someone take you home from the hospital or clinic. This information is not intended to replace advice given to you by your health care provider. Make sure you discuss any questions you have with your health care provider. Document Released: 04/12/2008  Document Revised: 04/12/2017 Document Reviewed: 12/15/2015 Elsevier Patient Education  2020 [employer].

## 2019-01-23 NOTE — Progress Notes (Signed)
PRENATAL VISIT NOTE  Subjective:  Rhonda Hoover is a 33 y.o. [phone] at [redacted]w[redacted]d being seen today for transfer of prenatal care from [place] as per her request.  Records from [facility] reviewed and are scanned in under Media tab.  She is currently monitored for the following issues for this high-risk pregnancy and has Short cervical length during pregnancy in second trimester; Pelvic pressure in pregnancy; History of preterm delivery, currently pregnant; History of cerclage, currently pregnant, second trimester; and Supervision of high risk pregnancy, antepartum on their problem list.  Patient reports continued pelvic pressure; has known short cervix and history of PTD. Already on Prometrium as per MFM.  Dr. Donalee Citrin recommended cerclage placement ASAP. She ants this scheduled very soon.  Contractions: Not present. Vag. Bleeding: None.  Movement: Present. Denies leaking of fluid.   The following portions of the patient's history were reviewed and updated as appropriate: allergies, current medications, past family history, past medical history, past social history, past surgical history and problem list.   Objective:   Vitals:   01/23/19 1021  BP: 128/81  Pulse: (!) 116  Weight: 170 lb (77.1 kg)    Fetal Status: Fetal Heart Rate (bpm): 152   Movement: Present     General:  Alert, oriented and cooperative. Patient is in no acute distress.  Skin: Skin is warm and dry. No rash noted.   Cardiovascular: Normal heart rate noted  Respiratory: Normal respiratory effort, no problems with respiration noted  Abdomen: Soft, gravid, appropriate for gestational age.  Pain/Pressure: Present     Pelvic: Cervical exam deferred        Extremities: Normal range of motion.  Edema: None  Mental Status: Normal mood and affect. Normal behavior. Normal judgment and thought content.   Imaging: US Ob Limited  Result Date: 01/20/2019 CLINICAL DATA:  Pelvic pain EXAM: LIMITED OBSTETRIC ULTRASOUND AND TRANSVAGINAL OBSTETRIC  ULTRASOUND FINDINGS: Number of Fetuses: 1 Heart Rate:  164 bpm Movement: Visualized Presentation: Cephalic Placental Location: Anterior Previa: No Amniotic Fluid (Subjective): Within normal limits. AFI: 7.3 cm BPD:  cm w  d MATERNAL FINDINGS: Cervix: 2.6 cm with funneling at the internal os. Debris/clot noted at the internal os. Uterus/Adnexae:  No abnormality visualized. IMPRESSION: Cervical length at the internal os 2.6 cm with funneling. Debris/clot noted at the internal os. This exam is performed on an emergent basis and does not comprehensively evaluate fetal size, dating, or anatomy; follow-up complete OB US should be considered if further fetal assessment is warranted. Electronically Signed   By: Rolm Baptise M.D.   On: 01/20/2019 22:50   US Ob Transvaginal  Result Date: 01/20/2019 CLINICAL DATA:  Pelvic pain EXAM: LIMITED OBSTETRIC ULTRASOUND AND TRANSVAGINAL OBSTETRIC ULTRASOUND FINDINGS: Number of Fetuses: 1 Heart Rate:  164 bpm Movement: Visualized Presentation: Cephalic Placental Location: Anterior Previa: No Amniotic Fluid (Subjective): Within normal limits. AFI: 7.3 cm BPD:  cm w  d MATERNAL FINDINGS: Cervix: 2.6 cm with funneling at the internal os. Debris/clot noted at the internal os. Uterus/Adnexae:  No abnormality visualized. IMPRESSION: Cervical length at the internal os 2.6 cm with funneling. Debris/clot noted at the internal os. This exam is performed on an emergent basis and does not comprehensively evaluate fetal size, dating, or anatomy; follow-up complete OB US should be considered if further fetal assessment is warranted. Electronically Signed   By: Rolm Baptise M.D.   On: 01/20/2019 22:50   [place] Mfm Ob Transvaginal  Result Date: 01/22/2019 ----------------------------------------------------------------------  OBSTETRICS REPORT                       (  Signed Final 01/22/2019 09:59 pm) ---------------------------------------------------------------------- Patient Info  ID #:        [id number]                          D.O.B.:  05/01/1986 (33 yrs)  Name:       Rhonda Hoover                    Visit Date: 01/21/2019 03:27 pm ---------------------------------------------------------------------- Performed By  Performed By:     Felecia Jan        Referred By:       Wallis                                      MAU/Triage  Attending:        Sander Nephew      [facility]                                        [place] ---------------------------------------------------------------------- Orders   #  Description                          Code         Ordered By   1  [place] MFM OB TRANSVAGINAL               [id number]      Phill Myron  ----------------------------------------------------------------------   #  Order #                    Accession #                 Episode #   1  [id number]                  [id number]                  [id number]  ---------------------------------------------------------------------- Indications   Encounter for cervical length                  Z36.86   [redacted] weeks gestation of pregnancy                Z3A.19   Pelvic pain affecting pregnancy in second      O26.892   trimester (cramping and pelvic pressure)   [redacted] weeks gestation of pregnancy                Z3A.19   Poor obstetric history: Previous preterm       O09.219   delivery, antepartum (x2)   History of cesarean delivery, currently  O34.219   pregnant (x2)   History of cervical cerclage, currently        O09.299   pregnant (x2)  ---------------------------------------------------------------------- Vital Signs                                                 Height:        5'7" ---------------------------------------------------------------------- Fetal Evaluation  Num Of Fetuses:          1  Fetal Heart Rate(bpm):   152  Cardiac Activity:        Observed  Presentation:             Cephalic  Amniotic Fluid  AFI FV:      Within normal limits                              Largest Pocket(cm)                              8 ---------------------------------------------------------------------- OB History  Gravidity:    6         Term:   1        Prem:   2        SAB:   2  Living:       3 ---------------------------------------------------------------------- Gestational Age  LMP:           19w 4d        Date:  09/06/18                 EDD:   06/13/19  Best:          19w 4d     Det. By:  LMP  (09/06/18)          EDD:   06/13/19 ---------------------------------------------------------------------- Cervix Uterus Adnexa  Cervix  Length:            2.6  cm.  Measured transvaginally, suboptimally seen. ---------------------------------------------------------------------- Impression  Limited exam  Cervical length 2.1-2.6 cm  Mild funneling  Personal history of second trimester loss with subsequent  cerclage x 2  placement reaching the third trimester. ---------------------------------------------------------------------- Recommendations  Establish care with general Ob/Gyn  Consider a history indicated cerclage.  MFM consultation if desired. ----------------------------------------------------------------------               Sander Nephew, MD Electronically Signed Final Report   01/22/2019 09:59 pm ----------------------------------------------------------------------   Assessment and Plan:  Pregnancy: [id number] at [redacted]w[redacted]d 1. Short cervical length during pregnancy in second trimester 2. Pelvic pressure in pregnancy 3. History of preterm delivery, currently pregnant 4. History of cerclage, currently pregnant, second trimester Cerclage placement scheduled for 01/26/19 at 9 am. Orders placed. Preoperative instructions given, also emphasized need for COVID testing preoperatively and quarantining until surgery. MFM recommended ultrasound one week after procedure, this was ordered.  - [facility] AMB  BABYSCRIPTS SCHEDULE OPTIMIZATION - [place] MFM OB DETAIL +14 WK; Future - [place] MFM OB TRANSVAGINAL; Future  5. Supervision of high risk pregnancy, antepartum Declined genetic screening. Has done all necessary other prenatal labs; these were reviewed.  Preterm labor symptoms and general obstetric precautions including but not limited to vaginal bleeding, contractions, leaking of fluid and fetal movement were reviewed in detail with the patient. Please refer to After  Visit Summary for other counseling recommendations.   Return in about 10 days (around 02/02/2019) for OFFICE [facility] Visit (also has MFM scan).  Future Appointments  Date Time Provider [place]  02/02/2019 10:35 AM Aletha Halim, MD [facility]    Verita Schneiders, MD

## 2019-01-24 ENCOUNTER — Other Ambulatory Visit (HOSPITAL_COMMUNITY): Payer: Medicaid Other

## 2019-01-25 NOTE — Anesthesia Preprocedure Evaluation (Addendum)
Anesthesia Evaluation  Patient identified by MRN, date of birth, ID band Patient awake    Reviewed: Allergy & Precautions, NPO status , Patient's Chart, lab work & pertinent test results  Airway Mallampati: II  TM Distance: >3 FB Neck ROM: Full    Dental  (+) Dental Advisory Given   Pulmonary neg pulmonary ROS,    Pulmonary exam normal breath sounds clear to auscultation       Cardiovascular negative cardio ROS Normal cardiovascular exam Rhythm:Regular Rate:Normal     Neuro/Psych negative neurological ROS  negative psych ROS   GI/Hepatic negative GI ROS, Neg liver ROS,   Endo/Other  negative endocrine ROS  Renal/GU negative Renal ROS     Musculoskeletal negative musculoskeletal ROS (+)   Abdominal   Peds  Hematology negative hematology ROS (+)   Anesthesia Other Findings   Reproductive/Obstetrics (+) Pregnancy                            Anesthesia Physical Anesthesia Plan  ASA: II  Anesthesia Plan: Spinal   Post-op Pain Management:    Induction: Intravenous  PONV Risk Score and Plan: 2 and Ondansetron, Dexamethasone, Scopolamine patch - Pre-op and Treatment may vary due to age or medical condition  Airway Management Planned:   Additional Equipment:   Intra-op Plan:   Post-operative Plan:   Informed Consent: I have reviewed the patients History and Physical, chart, labs and discussed the procedure including the risks, benefits and alternatives for the proposed anesthesia with the patient or authorized representative who has indicated his/her understanding and acceptance.     Dental advisory given  Plan Discussed with: CRNA  Anesthesia Plan Comments:       Anesthesia Quick Evaluation

## 2019-01-26 ENCOUNTER — Inpatient Hospital Stay (HOSPITAL_COMMUNITY): Payer: Medicaid Other

## 2019-01-26 ENCOUNTER — Encounter (HOSPITAL_COMMUNITY)
Admission: RE | Disposition: A | Discharge status: Other Acute Inpatient Hospital | Payer: Self-pay | Source: Home / Self Care | Attending: Obstetrics and Gynecology

## 2019-01-26 ENCOUNTER — Ambulatory Visit (HOSPITAL_COMMUNITY): Payer: Medicaid Other

## 2019-01-26 ENCOUNTER — Ambulatory Visit (HOSPITAL_COMMUNITY): Payer: Medicaid Other | Admitting: Anesthesiology

## 2019-01-26 ENCOUNTER — Inpatient Hospital Stay (HOSPITAL_COMMUNITY)
Admission: RE | Admit: 2019-01-26 | Discharge: 2019-01-27 | DRG: 819 | Disposition: A | Discharge status: Other Acute Inpatient Hospital | Payer: Medicaid Other | Attending: Obstetrics and Gynecology | Admitting: Obstetrics and Gynecology

## 2019-01-26 ENCOUNTER — Encounter (HOSPITAL_COMMUNITY): Payer: Self-pay | Admitting: *Deleted

## 2019-01-26 ENCOUNTER — Other Ambulatory Visit: Payer: Self-pay

## 2019-01-26 DIAGNOSIS — O3432 Maternal care for cervical incompetence, second trimester: Principal | ICD-10-CM | POA: Diagnosis present

## 2019-01-26 DIAGNOSIS — O09292 Supervision of pregnancy with other poor reproductive or obstetric history, second trimester: Secondary | ICD-10-CM

## 2019-01-26 DIAGNOSIS — O3430 Maternal care for cervical incompetence, unspecified trimester: Secondary | ICD-10-CM | POA: Diagnosis present

## 2019-01-26 DIAGNOSIS — O26872 Cervical shortening, second trimester: Secondary | ICD-10-CM

## 2019-01-26 DIAGNOSIS — Z3A20 20 weeks gestation of pregnancy: Secondary | ICD-10-CM

## 2019-01-26 DIAGNOSIS — Z3686 Encounter for antenatal screening for cervical length: Secondary | ICD-10-CM | POA: Diagnosis not present

## 2019-01-26 DIAGNOSIS — O34512 Maternal care for incarceration of gravid uterus, second trimester: Secondary | ICD-10-CM | POA: Diagnosis present

## 2019-01-26 HISTORY — PX: CERVICAL CERCLAGE: SHX1329

## 2019-01-26 LAB — CBC
HCT: 30.8 % — ABNORMAL LOW (ref 36.0–46.0)
Hemoglobin: 8.3 g/dL — ABNORMAL LOW (ref 12.0–15.0)
MCH: 17 pg — ABNORMAL LOW (ref 26.0–34.0)
MCHC: 26.9 g/dL — ABNORMAL LOW (ref 30.0–36.0)
MCV: 63.2 fL — ABNORMAL LOW (ref 80.0–100.0)
Platelets: 259 10*3/uL (ref 150–400)
RBC: 4.87 MIL/uL (ref 3.87–5.11)
RDW: 21 % — ABNORMAL HIGH (ref 11.5–15.5)
WBC: 8.1 10*3/uL (ref 4.0–10.5)
nRBC: 0 % (ref 0.0–0.2)

## 2019-01-26 LAB — TYPE AND SCREEN
ABO/RH(D): B POS
Antibody Screen: NEGATIVE

## 2019-01-26 SURGERY — CERCLAGE, CERVIX, VAGINAL APPROACH
Anesthesia: Spinal

## 2019-01-26 MED ORDER — DOCUSATE SODIUM 100 MG PO CAPS
100.0000 mg | ORAL_CAPSULE | Freq: Every day | ORAL | Status: DC
  Administered 2019-01-27: 100 mg via ORAL
  Filled 2019-01-26 (×2): qty 1

## 2019-01-26 MED ORDER — LACTATED RINGERS IV SOLN
INTRAVENOUS | Status: DC
  Administered 2019-01-26 (×2): via INTRAVENOUS

## 2019-01-26 MED ORDER — MAGNESIUM CITRATE PO SOLN
300.0000 mL | Freq: Once | ORAL | Status: AC
  Administered 2019-01-26: 296 mL via ORAL
  Filled 2019-01-26: qty 592

## 2019-01-26 MED ORDER — SOD CITRATE-CITRIC ACID 500-334 MG/5ML PO SOLN
30.0000 mL | ORAL | Status: AC
  Administered 2019-01-26: 30 mL via ORAL

## 2019-01-26 MED ORDER — GABAPENTIN 300 MG PO CAPS
ORAL_CAPSULE | ORAL | Status: AC
  Filled 2019-01-26: qty 1

## 2019-01-26 MED ORDER — FENTANYL CITRATE (PF) 100 MCG/2ML IJ SOLN: 25.0000 ug | INTRAMUSCULAR | Status: DC | PRN

## 2019-01-26 MED ORDER — METRONIDAZOLE 500 MG PO TABS
500.0000 mg | ORAL_TABLET | Freq: Two times a day (BID) | ORAL | Status: DC
  Administered 2019-01-26 – 2019-01-27 (×2): 500 mg via ORAL
  Filled 2019-01-26 (×3): qty 1

## 2019-01-26 MED ORDER — ACETAMINOPHEN 500 MG PO TABS
ORAL_TABLET | ORAL | Status: AC
  Filled 2019-01-26: qty 2

## 2019-01-26 MED ORDER — SOD CITRATE-CITRIC ACID 500-334 MG/5ML PO SOLN
ORAL | Status: AC
  Filled 2019-01-26: qty 30

## 2019-01-26 MED ORDER — PROGESTERONE MICRONIZED 200 MG PO CAPS
200.0000 mg | ORAL_CAPSULE | Freq: Every day | ORAL | Status: DC
  Administered 2019-01-26: 200 mg via VAGINAL
  Filled 2019-01-26: qty 2
  Filled 2019-01-26: qty 1

## 2019-01-26 MED ORDER — ACETAMINOPHEN 500 MG PO TABS
1000.0000 mg | ORAL_TABLET | ORAL | Status: AC
  Administered 2019-01-26: 1000 mg via ORAL

## 2019-01-26 MED ORDER — FLEET ENEMA 7-19 GM/118ML RE ENEM
1.0000 | ENEMA | Freq: Once | RECTAL | Status: AC
  Administered 2019-01-26: 15:00:00 1 via RECTAL

## 2019-01-26 MED ORDER — PHENYLEPHRINE HCL (PRESSORS) 10 MG/ML IV SOLN
INTRAVENOUS | Status: DC | PRN
  Administered 2019-01-26: 100 ug via INTRAVENOUS

## 2019-01-26 MED ORDER — ACETAMINOPHEN 325 MG PO TABS: 650.0000 mg | ORAL_TABLET | ORAL | Status: DC | PRN

## 2019-01-26 MED ORDER — PRENATAL MULTIVITAMIN CH
1.0000 | ORAL_TABLET | Freq: Every day | ORAL | Status: DC
  Administered 2019-01-26 – 2019-01-27 (×2): 1 via ORAL
  Filled 2019-01-26 (×3): qty 1

## 2019-01-26 MED ORDER — PROMETHAZINE HCL 25 MG/ML IJ SOLN: 6.2500 mg | INTRAMUSCULAR | Status: DC | PRN

## 2019-01-26 MED ORDER — GABAPENTIN 300 MG PO CAPS
300.0000 mg | ORAL_CAPSULE | ORAL | Status: AC
  Administered 2019-01-26: 300 mg via ORAL

## 2019-01-26 MED ORDER — ONDANSETRON HCL 4 MG/2ML IJ SOLN
INTRAMUSCULAR | Status: DC | PRN
  Administered 2019-01-26: 4 mg via INTRAVENOUS

## 2019-01-26 MED ORDER — CALCIUM CARBONATE ANTACID 500 MG PO CHEW
2.0000 | CHEWABLE_TABLET | ORAL | Status: DC | PRN
  Filled 2019-01-26: qty 2

## 2019-01-26 MED ORDER — LIDOCAINE HCL 1 % IJ SOLN
INTRAMUSCULAR | Status: AC
  Filled 2019-01-26: qty 20

## 2019-01-26 MED ORDER — METRONIDAZOLE IN NACL 5-0.79 MG/ML-% IV SOLN
500.0000 mg | INTRAVENOUS | Status: AC
  Administered 2019-01-26: 500 mg via INTRAVENOUS
  Filled 2019-01-26: qty 100

## 2019-01-26 MED ORDER — BUPIVACAINE IN DEXTROSE 0.75-8.25 % IT SOLN
INTRATHECAL | Status: DC | PRN
  Administered 2019-01-26: 1.4 mL via INTRATHECAL

## 2019-01-26 MED ORDER — MEPERIDINE HCL 25 MG/ML IJ SOLN: 6.2500 mg | INTRAMUSCULAR | Status: DC | PRN

## 2019-01-26 MED ORDER — GENTAMICIN SULFATE 40 MG/ML IJ SOLN
5.0000 mg/kg | INTRAVENOUS | Status: AC
  Administered 2019-01-26: 385.6 mg via INTRAVENOUS
  Filled 2019-01-26: qty 9.75

## 2019-01-26 SURGICAL SUPPLY — 17 items
CANISTER SUCT 3000ML PPV (MISCELLANEOUS) ×6 IMPLANT
GLOVE BIOGEL PI IND STRL 6.5 (GLOVE) ×1 IMPLANT
GLOVE BIOGEL PI IND STRL 7.0 (GLOVE) ×2 IMPLANT
GLOVE BIOGEL PI INDICATOR 6.5 (GLOVE) ×2
GLOVE BIOGEL PI INDICATOR 7.0 (GLOVE) ×4
GLOVE SURG SS PI 6.5 STRL IVOR (GLOVE) ×3 IMPLANT
GOWN STRL REUS W/TWL LRG LVL3 (GOWN DISPOSABLE) ×9 IMPLANT
NS IRRIG 1000ML POUR BTL (IV SOLUTION) ×3 IMPLANT
PACK VAGINAL MINOR WOMEN LF (CUSTOM PROCEDURE TRAY) ×3 IMPLANT
PAD OB MATERNITY 4.3X12.25 (PERSONAL CARE ITEMS) ×3 IMPLANT
PAD PREP 24X48 CUFFED NSTRL (MISCELLANEOUS) ×3 IMPLANT
SUT PROLENE 0 CT 1 30 (SUTURE) ×3 IMPLANT
TOWEL OR 17X24 6PK STRL BLUE (TOWEL DISPOSABLE) ×6 IMPLANT
TRAY FOLEY W/BAG SLVR 14FR (SET/KITS/TRAYS/PACK) ×3 IMPLANT
TUBING NON-CON 1/4 X 20 CONN (TUBING) ×2 IMPLANT
TUBING NON-CON 1/4 X 20' CONN (TUBING) ×1
YANKAUER SUCT BULB TIP NO VENT (SUCTIONS) ×3 IMPLANT

## 2019-01-26 NOTE — Anesthesia Procedure Notes (Signed)
Spinal  Patient location during procedure: OR Staffing Anesthesiologist: Nolon Nations, MD Performed: anesthesiologist  Preanesthetic Checklist Completed: patient identified, site marked, surgical consent, pre-op evaluation, timeout performed, IV checked, risks and benefits discussed and monitors and equipment checked Spinal Block Patient position: sitting Prep: site prepped and draped and DuraPrep Patient monitoring: heart rate, continuous pulse ox and blood pressure Approach: midline Location: L4-5 Injection technique: single-shot Needle Needle type: Sprotte  Needle gauge: 24 G Needle length: 9 cm Additional Notes Expiration date of kit checked and confirmed. Patient tolerated procedure well, without complications.

## 2019-01-26 NOTE — Op Note (Addendum)
Name: Rhonda Hoover MRN: [id number]  Surgeon: Dr. Mora Bellman Assistant: Dr. Tama High  Diagnosis: 20-weeks' gestation with cervical incompetence Procedure Planned: Cervical Cerclage.  I received a call from the OR and spoke with Dr. [name].  She mentioned that cervix could not be visualized on speculum examination and requested my presence for assistance in the procedure.  I drove to [place] from my [facility].  After reaching the OR, under sterile conditions I placed a weighted speculum into the vagina.  Posterior vaginal wall was bulging and the cervix could not be visualized.  On digital examination, the uterus seen acutely retroverted and the cervix appeared small (less than 1 cm) and effaced.  Vaginal vault retractions did not aid in visualization of cervix.  I could not feel any solid mass in the posterior vaginal wall.  On rectal examination there was no impacted feces.   Ultrasound was requested.  On transabdominal scan, the amniotic fluid is normal and good fetal activity seen.  Cephalic presentation.  Anterior uterine wall appears of normal.  We performed transvaginal ultrasound.  An irregular homogenous mass in the posterior wall of the uterus measuring 9 x 4 cm was seen.  Color Doppler flow was negative.  The cervix measures 4 cm and I feel it could be an over-measrurement because of the compression from the posterior wall.  On transverse view, the mass seems to be more well defined and has the appearance of a myoma.  However, I cannot definitively conclude it is a myoma or a mass.  Dr. [name] and I discussed and we made a decision not to proceed with cerclage.  We informed the patient (I counseled both patient and her husband in the PACU) for possible diagnosis and the reason for our inability to perform the procedure as planned.  Later, I reviewed images of ultrasound performed at 7 weeks.  The report mentioned a calcified anterior myoma.  I cannot comment on the  location of the myoma from the images.  I also discussed with Dr. [name] [facility]).  Patient transferred her care from [place].  Dr. [name] reviewed the prenatal records and informed that ultrasound detected a 5 cm Fibroid, but no vaginal examination was performed at her prenatal visit.  Our differential diagnoses include; a) posterior uterine mass that could be a myoma, b) impacted feces from constipation (seems unlikely).  Recommendations: -Patient to receive enema and vaginal examination after bowel evacuation to see if cervix could be visualized. -If cervix could not be visualized , MRI without contrast was obtained the nature of the mass. -Myoma or mass makes good visualization of cervix difficult, patient to be counseled on vaginal progesterone to be taken daily to [redacted] weeks gestation.  Appropriate consultation may be obtained for abdominal cerclage.

## 2019-01-26 NOTE — Op Note (Signed)
Rhonda Hoover   PROCEDURE DATE: 01/26/2019  PREOPERATIVE DIAGNOSIS: Intrauterine pregnancy at [redacted]w[redacted]d, history of cervical incompetence   POSTOPERATIVE DIAGNOSIS: The same PROCEDURE: Attempted transvaginal [place] Cervical Cerclage Placement SURGEON:  Dr. Mora Bellman CONSULTANT: Dr. Donalee Citrin   INDICATIONS: 33 y.o. [id number] at [redacted]w[redacted]d with history of cervical incompetence, here for cerclage placement.  The risks of surgery were discussed in detail with the patient including but not limited to: bleeding; infection which may require antibiotic therapy; injury to cervix, vagina other surrounding organs; risk of ruptured membranes and/or preterm delivery and other postoperative or anesthesia complications.  Written informed consent was obtained.    FINDINGS: palpable posterior soft mass preventing full visualization of cervix concerning for possible fibroid or large stool burden  ANESTHESIA:  Spinal INTRAVENOUS FLUIDS: 600  ml ESTIMATED BLOOD LOSS: 5 ml COMPLICATIONS: None immediate  PROCEDURE IN DETAIL:  The patient received intravenous antibiotics and had sequential compression devices applied to her lower extremities while in the preoperative area.  Reassuring fetal heart rate was also obtained using a doppler. She was then taken to the operating room where spinal anesthesia was administered and was found to be adequate.  She was placed in the dorsal lithotomy, and was prepped and draped in a sterile manner. Her bladder was catheterized for an unmeasured amount of clear, yellow urine.  After an adequate timeout was performed, a vaginal speculum was then placed in the patient's vagina but cervix could not be visualized despite several attempts. Dr. Donalee Citrin called in for assitance who also could not visualized cervix as well. Intraoperative ultrasound was performed. See his separate note for further details. Instrument, needle and sponge counts were correct x 2. The patient tolerated the procedure well, and  was taken to the recovery area awake and in stable condition. Reassuring fetal heart rate was also obtained using a doppler in the recovery area.

## 2019-01-26 NOTE — Anesthesia Postprocedure Evaluation (Signed)
Anesthesia Post Note  Patient: Rhonda Hoover  Procedure(s) Performed: CERCLAGE CERVICAL (N/A )     Patient location during evaluation: PACU Anesthesia Type: Spinal Level of consciousness: awake and alert Pain management: pain level controlled Vital Signs Assessment: post-procedure vital signs reviewed and stable Respiratory status: spontaneous breathing and respiratory function stable Cardiovascular status: blood pressure returned to baseline and stable Postop Assessment: spinal receding Anesthetic complications: no    Last Vitals:  Vitals:   01/26/19 1704 01/26/19 1929  BP: 104/68 116/66  Pulse: 81 84  Resp: 18 18  Temp:  36.6 C  SpO2:  100%    Last Pain:  Vitals:   01/26/19 2054  TempSrc:   PainSc: 0-No pain                 Nolon Nations

## 2019-01-26 NOTE — H&P (Signed)
Rhonda Hoover is a 33 y.o. female [phone] at [redacted]w[redacted]d presenting for rescue cerclage placement. Patient with a history of incompetent cervix x2 and has had cerclage placed with her last two pregnancies. Patient with shortening cervix with funneling on most recent ultrasound. Patient reports unchanged pelvic pressure. Patient denies vaginal bleeding or leakage of fluid. She reports good fetal movement and denies cramping/contractions.  OB History    Gravida  6   Para  3   Term  1   Preterm  2   AB  2   Living  3     SAB  2   TAB  0   Ectopic  0   Multiple      Live Births  3          Past Medical History:  Diagnosis Date  . Medical history non-contributory    Past Surgical History:  Procedure Laterality Date  . CERVICAL CERCLAGE     with each pregnancy  . CESAREAN SECTION     x2   Family History: family history includes Healthy in her father and mother. Social History:  reports that she has never smoked. She has never used smokeless tobacco. She reports that she does not drink alcohol or use drugs.     Maternal Diabetes: No Genetic Screening: Declined Maternal Ultrasounds/Referrals: Normal Fetal Ultrasounds or other Referrals:  None Maternal Substance Abuse:  No Significant Maternal Medications:  None Significant Maternal Lab Results:  None Other Comments:  None  ROS  See pertinent in HPI History   Blood pressure 120/67, pulse 91, temperature 98.7 F (37.1 C), temperature source Oral, resp. rate 16, height 5' 7.5" (1.715 m), weight 172 lb (78 kg), last menstrual period 09/06/2018, currently breastfeeding. Exam Physical Exam  GENERAL: Well-developed, well-nourished female in no acute distress.  LUNGS: Clear to auscultation bilaterally.  HEART: Regular rate and rhythm. ABDOMEN: Soft, nontender, gravid PELVIC: Deferred to OR EXTREMITIES: No cyanosis, clubbing, or edema, 2+ distal pulses.  Prenatal labs: ABO, Rh: --/--/B POS, B POS (06/10 [id number]) Antibody:  NEG (06/10 0943) Rubella:   RPR:    HBsAg:    HIV:    GBS:     US Ob Limited  Result Date: 01/20/2019 CLINICAL DATA:  Pelvic pain EXAM: LIMITED OBSTETRIC ULTRASOUND AND TRANSVAGINAL OBSTETRIC ULTRASOUND FINDINGS: Number of Fetuses: 1 Heart Rate:  164 bpm Movement: Visualized Presentation: Cephalic Placental Location: Anterior Previa: No Amniotic Fluid (Subjective): Within normal limits. AFI: 7.3 cm BPD:  cm w  d MATERNAL FINDINGS: Cervix: 2.6 cm with funneling at the internal os. Debris/clot noted at the internal os. Uterus/Adnexae:  No abnormality visualized. IMPRESSION: Cervical length at the internal os 2.6 cm with funneling. Debris/clot noted at the internal os. This exam is performed on an emergent basis and does not comprehensively evaluate fetal size, dating, or anatomy; follow-up complete OB US should be considered if further fetal assessment is warranted. Electronically Signed   By: Rolm Baptise M.D.   On: 01/20/2019 22:50   US Ob Transvaginal  Result Date: 01/20/2019 CLINICAL DATA:  Pelvic pain EXAM: LIMITED OBSTETRIC ULTRASOUND AND TRANSVAGINAL OBSTETRIC ULTRASOUND FINDINGS: Number of Fetuses: 1 Heart Rate:  164 bpm Movement: Visualized Presentation: Cephalic Placental Location: Anterior Previa: No Amniotic Fluid (Subjective): Within normal limits. AFI: 7.3 cm BPD:  cm w  d MATERNAL FINDINGS: Cervix: 2.6 cm with funneling at the internal os. Debris/clot noted at the internal os. Uterus/Adnexae:  No abnormality visualized. IMPRESSION: Cervical length at the internal os 2.6 cm  with funneling. Debris/clot noted at the internal os. This exam is performed on an emergent basis and does not comprehensively evaluate fetal size, dating, or anatomy; follow-up complete OB US should be considered if further fetal assessment is warranted. Electronically Signed   By: Rolm Baptise M.D.   On: 01/20/2019 22:50   [place] [facility] Ob Transvaginal  Result Date:  01/22/2019 ----------------------------------------------------------------------  OBSTETRICS REPORT                       (Signed Final 01/22/2019 09:59 pm) ---------------------------------------------------------------------- Patient Info  ID #:       [id number]                          D.O.B.:  Sep 25, 1985 (33 yrs)  Name:       [name]                    Visit Date: 01/21/2019 03:27 pm ---------------------------------------------------------------------- Performed By  Performed By:     Felecia Jan        Referred By:       [place]                                      MAU/Triage  Attending:        Sander Nephew      [facility]                    MD                                        [place] ---------------------------------------------------------------------- Orders   #  Description                          Code         Ordered By   1  [place] [facility] OB TRANSVAGINAL               [id number]      Phill Myron  ----------------------------------------------------------------------   #  Order #                    Accession #                 Episode #   1  [id number]                  [id number]                  [id number]  ---------------------------------------------------------------------- Indications   Encounter for cervical length                  Z36.86   [redacted] weeks gestation of pregnancy  Z3A.19   Pelvic pain affecting pregnancy in second      O26.892   trimester (cramping and pelvic pressure)   [redacted] weeks gestation of pregnancy                Z3A.19   Poor obstetric history: Previous preterm       O09.219   delivery, antepartum (x2)   History of cesarean delivery, currently        O46.219   pregnant (x2)   History of cervical cerclage, currently        O09.299   pregnant (x2)  ---------------------------------------------------------------------- Vital Signs                                                  Height:        5'7" ---------------------------------------------------------------------- Fetal Evaluation  Num Of Fetuses:          1  Fetal Heart Rate(bpm):   152  Cardiac Activity:        Observed  Presentation:            Cephalic  Amniotic Fluid  AFI FV:      Within normal limits                              Largest Pocket(cm)                              8 ---------------------------------------------------------------------- OB History  Gravidity:    6         Term:   1        Prem:   2        SAB:   2  Living:       3 ---------------------------------------------------------------------- Gestational Age  LMP:           19w 4d        Date:  09/06/18                 EDD:   06/13/19  Best:          19w 4d     Det. By:  LMP  (09/06/18)          EDD:   06/13/19 ---------------------------------------------------------------------- Cervix Uterus Adnexa  Cervix  Length:            2.6  cm.  Measured transvaginally, suboptimally seen. ---------------------------------------------------------------------- Impression  Limited exam  Cervical length 2.1-2.6 cm  Mild funneling  Personal history of second trimester loss with subsequent  cerclage x 2  placement reaching the third trimester. ---------------------------------------------------------------------- Recommendations  Establish care with general Ob/Gyn  Consider a history indicated cerclage.  MFM consultation if desired. ----------------------------------------------------------------------               Sander Nephew, MD Electronically Signed Final Report   01/22/2019 09:59 pm ----------------------------------------------------------------------    Assessment/Plan: 33 yo [id number] at 20w2 with history of incompetent cervix here for rescue cerclage placement -  The risks of surgery were discussed in detail with the patient including but not limited to: bleeding; infection which may require antibiotic therapy; injury to cervix,  vagina other surrounding organs; risk of ruptured membranes and/or preterm delivery and other postoperative or anesthesia complications.  All questions were answered  Vadhir Mcnay 01/26/2019, 8:54 AM

## 2019-01-26 NOTE — Transfer of Care (Signed)
Immediate Anesthesia Transfer of Care Note  Patient: Rhonda Hoover  Procedure(s) Performed: CERCLAGE CERVICAL (N/A )  Patient Location: PACU  Anesthesia Type:Spinal  Level of Consciousness: awake, alert , oriented and patient cooperative  Airway & Oxygen Therapy: Patient Spontanous Breathing  Post-op Assessment: Report given to RN, Post -op Vital signs reviewed and stable and Patient moving all extremities X 4  Post vital signs: Reviewed and stable  Last Vitals:  Vitals Value Taken Time  BP 105/67 01/26/19 1104  Temp    Pulse 82 01/26/19 1106  Resp 20 01/26/19 1106  SpO2 100 % 01/26/19 1106  Vitals shown include unvalidated device data.  Last Pain:  Vitals:   01/26/19 0741  TempSrc: Oral  PainSc: 0-No pain         Complications: No apparent anesthesia complications

## 2019-01-27 ENCOUNTER — Encounter (HOSPITAL_COMMUNITY): Payer: Self-pay | Admitting: *Deleted

## 2019-01-27 NOTE — Discharge Summary (Signed)
OB Discharge Summary     Patient Name: Rhonda Hoover DOB: [birth year] MRN: [id number]  Date of admission: 01/26/2019 Delivering MD: This patient has no babies on file.  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 She had cerclages with her last 2 pregnancies. Patient presented for scheduled rescue cerclage placement due to shortened cervix of 2.6 cm seen on 9/11 ultrasound. Cerclage could not be placed as cervix could not be visualized or palpated due to concern for stool burden in rectum, pelvic mass or significantly retroverted uterus. Pelvic MRI demonstrated an incarcerated uterus. Patient remains asymptomatic and is ambulating and voiding without any discomfort. She reports improvement in her pelvic pressure following a bowel movement. Case was reviewed by MFM who coordinated transfer of care to [facility] for possible reduction of incarcerated uterus and evaluation for cerclage placement, Patient found stable for transfer with Dr. Glori Luis accepting physician  Physical exam  Vitals:   01/26/19 2300 01/27/19 0657 01/27/19 0749 01/27/19 1115  BP:  106/68 106/71 109/62  Pulse: 78 73 75 85  Resp: 18 18 18 18   Temp: 98.4 F (36.9 C) 98.4 F (36.9 C) 98.5 F (36.9 C) 98.5 F (36.9 C)  TempSrc: Oral  Oral Oral  SpO2: 100% 100% 100% 100%  Weight:      Height:       GENERAL: Well-developed, well-nourished female in no acute distress.  LUNGS: Clear to auscultation bilaterally.   HEART: Regular rate and rhythm. ABDOMEN: Soft, nontender, gravif PELVIC: not performed EXTREMITIES: No cyanosis, clubbing, or edema, 2+ distal pulses.  Labs: Lab Results  Component Value Date   WBC 8.1 01/26/2019   HGB 8.3 (L) 01/26/2019   HCT 30.8 (L) 01/26/2019   MCV 63.2 (L) 01/26/2019   PLT 259 01/26/2019   CMP Latest Ref Rng & Units 03/03/2016  Glucose 65 - 99 mg/dL 83  BUN 6 - 20 mg/dL 9  Creatinine 0.44 - 1.00 mg/dL 0.76  Sodium 135 - 145 mmol/L 137  Potassium 3.5 - 5.1 mmol/L 3.8  Chloride 101 - 111 mmol/L 107  CO2 22 - 32 mmol/L 23  Calcium 8.9 - 10.3 mg/dL 9.0  Total Protein 6.5 - 8.1 g/dL 7.0  Total Bilirubin 0.3 - 1.2 mg/dL 0.3  Alkaline Phos 38 - 126 U/L 35(L)  AST 15 - 41 U/L 20  ALT 14 - 54 U/L 12(L)    Discharge instruction: per After Visit Summary and "Baby and Me Booklet".  After visit meds:  Allergies as of 01/27/2019      Reactions   Penicillins  Rash   Childhood allergy Did it involve swelling of the face/tongue/throat, SOB, or low BP? No Did it involve sudden or severe rash/hives, skin peeling, or any reaction on the inside of your mouth or nose? Yes Did you need to seek medical attention at a hospital or doctor's office? Yes When did it last happen?childhood reaction If all above answers are "NO", may proceed with cephalosporin use.      Medication List    TAKE these medications   CRANBERRY/PROBIOTIC PO Take 1 capsule by mouth every evening.   metroNIDAZOLE 500 MG tablet Commonly known as: FLAGYL Take 1 tablet (500 mg total) by mouth 2 (two) times daily.   prenatal multivitamin Tabs tablet Take 1 tablet by mouth every evening.   progesterone 200 MG capsule Commonly known as: PROMETRIUM Place 1 capsule (200 mg total) vaginally daily. What changed: when to take this       Diet: routine diet  Activity: Advance as tolerated.   Outpatient follow up: as scheduled on 02/02/19 for on going prenatal care Follow up Appt: Future  Appointments  Date Time Provider [place]  02/02/2019 10:35 AM Aletha Halim, MD [place] [facility]   Follow up Visit:No follow-ups on file.   01/27/2019 Mora Bellman, MD

## 2019-01-27 NOTE — Progress Notes (Signed)
[facility] called for transport. 

## 2019-01-27 NOTE — Progress Notes (Signed)
Patient ID: Rhonda Hoover, female   DOB: 12/20/1985, 33 y.o.   MRN: [id number] [place]) NOTE  Rhonda Hoover is a 33 y.o. [id number] at [redacted]w[redacted]d  who is admitted for incompetent cervix and failed cerclage placement.    Fetal presentation is variable. Length of Stay:  1  Days  Date of admission:01/26/2019  Subjective: Patient reports feeling well and improvement in her pelvic pressure following a bowel movement. She denies vaginal bleeding, leakage of fluid or contraction. She is voiding without complaints Patient reports the fetal movement as active. Patient reports uterine contraction  activity as none. Patient reports  vaginal bleeding as none. Patient describes fluid per vagina as None.  Vitals:  Blood pressure 106/71, pulse 75, temperature 98.5 F (36.9 C), temperature source Oral, resp. rate 18, height 5' 7.5" (1.715 m), weight 78 kg, last menstrual period 09/06/2018, SpO2 100 %, currently breastfeeding. Vitals:   01/26/19 1929 01/26/19 2300 01/27/19 0657 01/27/19 0749  BP: 116/66 102/67 106/68 106/71  Pulse: 84 78 73 75  Resp: 18 18 18 18   Temp: 97.9 F (36.6 C) 98.4 F (36.9 C) 98.4 F (36.9 C) 98.5 F (36.9 C)  TempSrc:  Oral  Oral  SpO2: 100% 100% 100% 100%  Weight:      Height:       Physical Examination: GENERAL: Well-developed, well-nourished female in no acute distress.  HEENT: Normocephalic, atraumatic. Sclerae anicteric.  NECK: Supple. Normal thyroid.  LUNGS: Clear to auscultation bilaterally.  HEART: Regular rate and rhythm. ABDOMEN: Soft, nontender, gravid PELVIC: Not performed EXTREMITIES: No cyanosis, clubbing, or edema, 2+ distal pulses.   Fetal Monitoring:     + doppler  Labs:  Results for orders placed or performed during the hospital encounter of 01/26/19 (from the past 24 hour(s))  Type and screen [place]   Collection Time: 01/26/19  4:01 PM  Result Value Ref Range   ABO/RH(D) B POS    Antibody Screen  NEG    Sample Expiration      01/29/2019,2359 Performed at [facility], [place] [address] [place], [place] [address]     Imaging Studies:    Mr Pelvis Wo Contrast  Result Date: 01/26/2019 CLINICAL DATA:  Pelvic pain and pressure. Pelvic floor mass or prolapse on exam. Previous pregnancy loss due to incompetent cervix. [redacted] weeks pregnant. EXAM: MRI PELVIS WITHOUT CONTRAST TECHNIQUE: Multiplanar multisequence MR imaging of the pelvis was performed. No intravenous contrast was administered. COMPARISON:  None. FINDINGS: Lower Urinary Tract: Urinary bladder is displaced anteriorly by gravid uterus. Bowel: Rectum compress posteriorly by incarcerated uterine fundus, as described below. Vascular/Lymphatic: Unremarkable. No pathologically enlarged pelvic lymph nodes identified. Reproductive: -- Uterus: A single intrauterine fetus is seen in transverse lie, with head to maternal right. The fetus is seen in the lower uterine segment. The uterus is retroverted, and the uterine fundus is "incarcerated" between the sacral promontory and pubic symphysis. The cervix is displaced superiorly, along the anterior aspect of the incarcerated uterine fundus. An intramural fibroid is seen in the left posterior corpus, just superior to the incarcerated portion of the fundus, and this measures 5.4 by 3.9 cm. -- Right ovary: Not well visualized, however no adnexal mass identified. -- Left ovary: Not well visualized, however no adnexal mass identified. Other: No peritoneal thickening or abnormal free fluid. Musculoskeletal:  Unremarkable. IMPRESSION: Incarcerated gravid uterus. Single intrauterine fetus is located in the lower uterine segment, superior to the "incarcerated" fundal portion of the uterus. 5.4 cm left  posterior uterine fibroid. These results were called by telephone at the time of interpretation on 01/26/2019 at 9:45 pm to provider Dr. Elonda Husky, who verbally acknowledged these results. Electronically Signed   By: Marlaine Hind M.D.   On: 01/26/2019 21:50   US Ob Limited  Result Date: 01/20/2019 CLINICAL DATA:  Pelvic pain EXAM: LIMITED OBSTETRIC ULTRASOUND AND TRANSVAGINAL OBSTETRIC ULTRASOUND FINDINGS: Number of Fetuses: 1 Heart Rate:  164 bpm Movement: Visualized Presentation: Cephalic Placental Location: Anterior Previa: No Amniotic Fluid (Subjective): Within normal limits. AFI: 7.3 cm BPD:  cm w  d MATERNAL FINDINGS: Cervix: 2.6 cm with funneling at the internal os. Debris/clot noted at the internal os. Uterus/Adnexae:  No abnormality visualized. IMPRESSION: Cervical length at the internal os 2.6 cm with funneling. Debris/clot noted at the internal os. This exam is performed on an emergent basis and does not comprehensively evaluate fetal size, dating, or anatomy; follow-up complete OB US should be considered if further fetal assessment is warranted. Electronically Signed   By: Rolm Baptise M.D.   On: 01/20/2019 22:50   US Ob Transvaginal  Result Date: 01/20/2019 CLINICAL DATA:  Pelvic pain EXAM: LIMITED OBSTETRIC ULTRASOUND AND TRANSVAGINAL OBSTETRIC ULTRASOUND FINDINGS: Number of Fetuses: 1 Heart Rate:  164 bpm Movement: Visualized Presentation: Cephalic Placental Location: Anterior Previa: No Amniotic Fluid (Subjective): Within normal limits. AFI: 7.3 cm BPD:  cm w  d MATERNAL FINDINGS: Cervix: 2.6 cm with funneling at the internal os. Debris/clot noted at the internal os. Uterus/Adnexae:  No abnormality visualized. IMPRESSION: Cervical length at the internal os 2.6 cm with funneling. Debris/clot noted at the internal os. This exam is performed on an emergent basis and does not comprehensively evaluate fetal size, dating, or anatomy; follow-up complete OB US should be considered if further fetal assessment is warranted. Electronically Signed   By: Rolm Baptise M.D.   On: 01/20/2019 22:50   [place] Mfm Ob Transvaginal  Result Date: 01/26/2019 ----------------------------------------------------------------------   OBSTETRICS REPORT                       (Signed Final 01/26/2019 05:28 pm) ---------------------------------------------------------------------- Patient Info  ID #:       [id number]                          D.O.B.:  [birth year] (33 yrs)  Name:       Rhonda Hoover                    Visit Date: 01/26/2019 01:41 pm ---------------------------------------------------------------------- Performed By  Performed By:     Berlinda Last          Referred By:      [place]  Attending:        Tama High MD        [facility]                                                             [place] ---------------------------------------------------------------------- Orders   #  Description  Code         Ordered By   1  [place] MFM OB LIMITED                    [id number]     RAVI SHANKAR   2  [place] MFM OB TRANSVAGINAL               [id number]      RAVI [facility]  ----------------------------------------------------------------------   #  Order #                    Accession #                 Episode #   1  [id number]                  [id number]   2  [id number]                  [id number]                  [id number]  ---------------------------------------------------------------------- Indications   Encounter for cervical length                  Z36.86   [redacted] weeks gestation of pregnancy                Z3A.20   History of cervical cerclage, currently        O09.299   pregnant  ---------------------------------------------------------------------- Vital Signs                                                 Height:        5'7" ---------------------------------------------------------------------- Fetal Evaluation  Num Of Fetuses:         1  Fetal Heart Rate(bpm):  152  Cardiac Activity:       Observed  Presentation:           Cephalic  Placenta:               Anterior Fundal  Amniotic Fluid  AFI FV:      Within normal limits  ---------------------------------------------------------------------- OB History  Gravidity:    6         Term:   1        Prem:   2        SAB:   2  Living:       3 ---------------------------------------------------------------------- Gestational Age  LMP:           20w 2d        Date:  09/06/18                 EDD:   06/13/19  Best:          Hyacinth Meeker 2d     Det. By:  LMP  (09/06/18)          EDD:   06/13/19 ---------------------------------------------------------------------- Cervix Uterus Adnexa  Cervix  Length:              4  cm. ---------------------------------------------------------------------- Impression  Ultrasound was requested from the OR.  On transabdominal  scan, the amniotic fluid is normal and good fetal activity seen.  Cephalic presentation.  Anterior uterine  wall appears of  normal.  We performed transvaginal ultrasound.  An irregular  homogenous mass in the posterior wall of the uterus  measuring 9 x 4 cm was seen.  Color Doppler flow was  negative.  The cervix measures 4 cm and I feel it could be an  over measurement because of the compression from the  posterior wall.  On transverse view, the mass seems to be  more well defined and has the appearance of a myoma.  However, I cannot definitively conclude it is a myoma or a  mass. ---------------------------------------------------------------------- Recommendations  -Patient to receive enema and vaginal examination after  bowel evacuation to see if cervix could be visualized.  -If cervix could not be visualized , MRI without contrast was  obtained the nature of the mass.  -Myoma or mass makes good visualization of cervix difficult,  patient to be counseled on vaginal progesterone to be taken  daily to [redacted] weeks gestation.  Appropriate consultation may be  obtained for abdominal cerclage. ----------------------------------------------------------------------                  Tama High, MD Electronically Signed Final Report   01/26/2019 05:28 pm  ----------------------------------------------------------------------  [place] Mfm Ob Transvaginal  Result Date: 01/22/2019 ----------------------------------------------------------------------  OBSTETRICS REPORT                       (Signed Final 01/22/2019 09:59 pm) ---------------------------------------------------------------------- Patient Info  ID #:       [id number]                          D.O.B.:  January 18, 1986 (33 yrs)  Name:       Rhonda Hoover                    Visit Date: 01/21/2019 03:27 pm ---------------------------------------------------------------------- Performed By  Performed By:     Felecia Jan        Referred By:       [place]                                      MAU/Triage  Attending:        Sander Nephew      [facility]                    MD                                        [place] ---------------------------------------------------------------------- Orders   #  Description                          Code         Ordered By   1  [place] MFM OB TRANSVAGINAL               [phone]      CATHERINE  WALLACE  ----------------------------------------------------------------------   #  Order #                    Accession #                 Episode #   1  [id number]                  [id number]  ---------------------------------------------------------------------- Indications   Encounter for cervical length                  Z36.86   [redacted] weeks gestation of pregnancy                Z3A.19   Pelvic pain affecting pregnancy in second      O26.892   trimester (cramping and pelvic pressure)   [redacted] weeks gestation of pregnancy                Z3A.19   Poor obstetric history: Previous preterm       O09.219   delivery, antepartum (x2)   History of cesarean delivery, currently        O2.219   pregnant (x2)   History of cervical cerclage, currently        O09.299    pregnant (x2)  ---------------------------------------------------------------------- Vital Signs                                                 Height:        5'7" ---------------------------------------------------------------------- Fetal Evaluation  Num Of Fetuses:          1  Fetal Heart Rate(bpm):   152  Cardiac Activity:        Observed  Presentation:            Cephalic  Amniotic Fluid  AFI FV:      Within normal limits                              Largest Pocket(cm)                              8 ---------------------------------------------------------------------- OB History  Gravidity:    6         Term:   1        Prem:   2        SAB:   2  Living:       3 ---------------------------------------------------------------------- Gestational Age  LMP:           19w 4d        Date:  09/06/18                 EDD:   06/13/19  Best:          19w 4d     Det. By:  LMP  (09/06/18)          EDD:   06/13/19 ---------------------------------------------------------------------- Cervix Uterus Adnexa  Cervix  Length:            2.6  cm.  Measured transvaginally, suboptimally seen. ---------------------------------------------------------------------- Impression  Limited exam  Cervical length 2.1-2.6 cm  Mild  funneling  Personal history of second trimester loss with subsequent  cerclage x 2  placement reaching the third trimester. ---------------------------------------------------------------------- Recommendations  Establish care with general Ob/Gyn  Consider a history indicated cerclage.  MFM consultation if desired. ----------------------------------------------------------------------               Sander Nephew, MD Electronically Signed Final Report   01/22/2019 09:59 pm ----------------------------------------------------------------------  [place] Mfm Ob Limited  Result Date: 01/26/2019 ----------------------------------------------------------------------  OBSTETRICS REPORT                       (Signed  Final 01/26/2019 05:28 pm) ---------------------------------------------------------------------- Patient Info  ID #:       [id number]                          D.O.B.:  09/21/1985 (33 yrs)  Name:       Rhonda Hoover                    Visit Date: 01/26/2019 01:41 pm ---------------------------------------------------------------------- Performed By  Performed By:     Berlinda Last          Referred By:      [place]  Attending:        Tama High MD        [facility]                                                             [place] ---------------------------------------------------------------------- Orders   #  Description                          Code         Ordered By   1  [place] MFM OB LIMITED                    [id number]     RAVI SHANKAR   2  [place] MFM OB TRANSVAGINAL               [id number]      Tama High  ----------------------------------------------------------------------   #  Order #                    Accession #                 Episode #   1  [id number]                  [id number]                  [id number]   2  [id number]                  [id number]                  [id number]  ---------------------------------------------------------------------- Indications   Encounter for cervical length                  Z36.86   [redacted] weeks gestation of pregnancy  Z3A.20   History of cervical cerclage, currently        O09.299   pregnant  ---------------------------------------------------------------------- Vital Signs                                                 Height:        5'7" ---------------------------------------------------------------------- Fetal Evaluation  Num Of Fetuses:         1  Fetal Heart Rate(bpm):  152  Cardiac Activity:       Observed  Presentation:           Cephalic  Placenta:               Anterior Fundal  Amniotic Fluid  AFI FV:      Within normal limits ---------------------------------------------------------------------- OB  History  Gravidity:    6         Term:   1        Prem:   2        SAB:   2  Living:       3 ---------------------------------------------------------------------- Gestational Age  LMP:           20w 2d        Date:  09/06/18                 EDD:   06/13/19  Best:          Hyacinth Meeker 2d     Det. By:  LMP  (09/06/18)          EDD:   06/13/19 ---------------------------------------------------------------------- Cervix Uterus Adnexa  Cervix  Length:              4  cm. ---------------------------------------------------------------------- Impression  Ultrasound was requested from the OR.  On transabdominal  scan, the amniotic fluid is normal and good fetal activity seen.  Cephalic presentation.  Anterior uterine wall appears of  normal.  We performed transvaginal ultrasound.  An irregular  homogenous mass in the posterior wall of the uterus  measuring 9 x 4 cm was seen.  Color Doppler flow was  negative.  The cervix measures 4 cm and I feel it could be an  over measurement because of the compression from the  posterior wall.  On transverse view, the mass seems to be  more well defined and has the appearance of a myoma.  However, I cannot definitively conclude it is a myoma or a  mass. ---------------------------------------------------------------------- Recommendations  -Patient to receive enema and vaginal examination after  bowel evacuation to see if cervix could be visualized.  -If cervix could not be visualized , MRI without contrast was  obtained the nature of the mass.  -Myoma or mass makes good visualization of cervix difficult,  patient to be counseled on vaginal progesterone to be taken  daily to [redacted] weeks gestation.  Appropriate consultation may be  obtained for abdominal cerclage. ----------------------------------------------------------------------                  Tama High, MD Electronically Signed Final Report   01/26/2019 05:28 pm  ----------------------------------------------------------------------    Medications:  Scheduled . docusate sodium  100 mg Oral Daily  . metroNIDAZOLE  500 mg Oral BID  . prenatal multivitamin  1 tablet Oral Q1200  . progesterone  200 mg Vaginal QHS   I have reviewed the patient's current  medications.  ASSESSMENT: G6P1223 [redacted]w[redacted]d Estimated Date of Delivery: 06/13/19  Patient Active Problem List   Diagnosis Date Noted  . Incompetent cervix in pregnancy, antepartum 01/26/2019  . Short cervical length during pregnancy in second trimester 01/23/2019  . Pelvic pressure in pregnancy 01/23/2019  . History of preterm delivery, currently pregnant 01/23/2019  . History of cerclage, currently pregnant, second trimester 01/23/2019  . Supervision of high risk pregnancy, antepartum 01/23/2019    PLAN: - Will change diet to a regular diet - MRI results reviewed with the patient - Case discussed with MFM who is coordinating transfer of care to a facility that can liberate the incarcerated uterus and place cerclage - Continue vaginal prometrium - All questions were answered - Continue routine antepartum care   Rhonda Hoover 01/27/2019,8:45 AM

## 2019-01-27 NOTE — Progress Notes (Signed)
Report  Called to [facility]  At L&D  RN KELLY   Pt to arrive at triage area   For admissions

## 2019-01-28 ENCOUNTER — Encounter (HOSPITAL_COMMUNITY): Payer: Self-pay | Admitting: Obstetrics and Gynecology

## 2019-02-02 ENCOUNTER — Ambulatory Visit (INDEPENDENT_AMBULATORY_CARE_PROVIDER_SITE_OTHER): Payer: Medicaid Other | Admitting: Obstetrics and Gynecology

## 2019-02-02 ENCOUNTER — Other Ambulatory Visit: Payer: Self-pay

## 2019-02-02 ENCOUNTER — Encounter: Payer: Self-pay | Admitting: Obstetrics and Gynecology

## 2019-02-02 ENCOUNTER — Encounter: Payer: Medicaid Other | Admitting: Obstetrics and Gynecology

## 2019-02-02 VITALS — BP 118/75 | HR 100 | Temp 98.6°F | Wt 172.9 lb

## 2019-02-02 DIAGNOSIS — O09212 Supervision of pregnancy with history of pre-term labor, second trimester: Secondary | ICD-10-CM

## 2019-02-02 DIAGNOSIS — Z98891 History of uterine scar from previous surgery: Secondary | ICD-10-CM

## 2019-02-02 DIAGNOSIS — Z3A21 21 weeks gestation of pregnancy: Secondary | ICD-10-CM

## 2019-02-02 DIAGNOSIS — A5901 Trichomonal vulvovaginitis: Secondary | ICD-10-CM | POA: Insufficient documentation

## 2019-02-02 DIAGNOSIS — O0992 Supervision of high risk pregnancy, unspecified, second trimester: Secondary | ICD-10-CM

## 2019-02-02 DIAGNOSIS — O3432 Maternal care for cervical incompetence, second trimester: Secondary | ICD-10-CM

## 2019-02-02 DIAGNOSIS — O0990 Supervision of high risk pregnancy, unspecified, unspecified trimester: Secondary | ICD-10-CM

## 2019-02-02 DIAGNOSIS — D563 Thalassemia minor: Secondary | ICD-10-CM | POA: Insufficient documentation

## 2019-02-02 DIAGNOSIS — O99012 Anemia complicating pregnancy, second trimester: Secondary | ICD-10-CM

## 2019-02-02 DIAGNOSIS — O23599 Infection of other part of genital tract in pregnancy, unspecified trimester: Secondary | ICD-10-CM | POA: Insufficient documentation

## 2019-02-02 DIAGNOSIS — O3430 Maternal care for cervical incompetence, unspecified trimester: Secondary | ICD-10-CM | POA: Insufficient documentation

## 2019-02-02 DIAGNOSIS — D219 Benign neoplasm of connective and other soft tissue, unspecified: Secondary | ICD-10-CM | POA: Insufficient documentation

## 2019-02-02 DIAGNOSIS — O09899 Supervision of other high risk pregnancies, unspecified trimester: Secondary | ICD-10-CM

## 2019-02-02 DIAGNOSIS — O34519 Maternal care for incarceration of gravid uterus, unspecified trimester: Secondary | ICD-10-CM | POA: Insufficient documentation

## 2019-02-02 NOTE — Progress Notes (Signed)
Prenatal Visit Note Date: 02/02/2019 Clinic: [facility]  Subjective:  Rhonda Hoover is a 33 y.o. [id number] at [redacted]w[redacted]d being seen today for ongoing prenatal care.  She is currently monitored for the following issues for this high-risk pregnancy and has Pelvic pressure in pregnancy; History of preterm delivery, currently pregnant; History of cerclage, currently pregnant, second trimester; Supervision of high risk pregnancy, antepartum; Incompetent cervix in pregnancy, antepartum; Fibroid; History of Incarcerated gravid uterus; Cervical cerclage suture present; Trichomonal vaginitis during pregnancy; Alpha thalassemia trait; and History of classical cesarean section on their problem list.  Patient reports no complaints.   Contractions: Not present. Vag. Bleeding: None.  Movement: Present. Denies leaking of fluid.   The following portions of the patient's history were reviewed and updated as appropriate: allergies, current medications, past family history, past medical history, past social history, past surgical history and problem list. Problem list updated.  Objective:   Vitals:   02/02/19 1039  BP: 118/75  Pulse: 100  Temp: 98.6 F (37 C)  Weight: 172 lb 14.4 oz (78.4 kg)    Fetal Status: Fetal Heart Rate (bpm): 164   Movement: Present     General:  Alert, oriented and cooperative. Patient is in no acute distress.  Skin: Skin is warm and dry. No rash noted.   Cardiovascular: Normal heart rate noted  Respiratory: Normal respiratory effort, no problems with respiration noted  Abdomen: Soft, gravid, appropriate for gestational age. Pain/Pressure: Absent     Pelvic:  Cervical exam deferred        Extremities: Normal range of motion.  Edema: None  Mental Status: Normal mood and affect. Normal behavior. Normal judgment and thought content.   Urinalysis:      Assessment and Plan:  Pregnancy: [id number] at [redacted]w[redacted]d  1. History of classical cesarean section Seen on review of  [facility] records from [place], [place]. D/w her and we recommend 37wk rpt c/s and cerclage removal. Can ask about BTL nv  2. Anemia during pregnancy in second trimester Has alpha thal trait. hgb 8 and low ferritin early august. Pt on liquid iron. Will recheck today and if still low d/w her recommend iv iron - Vitamin B12 - Folate - Iron and TIBC - Ferritin - CBC  3. Supervision of high risk pregnancy, antepartum Routine care.  Ask about btl nv Pt has never had anatomy u/s. Can do with cx length - Vitamin B12 - Folate - Iron and TIBC - Ferritin - CBC  4. Cerlcage in place I told her to go ahead and stay on the vag progesterone D/w dr. Donalee Citrin and will get rpt CL in about a week.  5. H/o PTB Too late for 17p See above  6. Trichomonas  TOC mid october  Preterm labor symptoms and general obstetric precautions including but not limited to vaginal bleeding, contractions, leaking of fluid and fetal movement were reviewed in detail with the patient. Please refer to After Visit Summary for other counseling recommendations.  Return in about 2 weeks (around 02/16/2019).   Aletha Halim, MD

## 2019-02-03 ENCOUNTER — Encounter: Payer: Self-pay | Admitting: Obstetrics and Gynecology

## 2019-02-03 ENCOUNTER — Other Ambulatory Visit: Payer: Self-pay | Admitting: Obstetrics and Gynecology

## 2019-02-03 DIAGNOSIS — O99019 Anemia complicating pregnancy, unspecified trimester: Secondary | ICD-10-CM | POA: Insufficient documentation

## 2019-02-03 LAB — IRON AND TIBC
Iron Saturation: 4 % — CL (ref 15–55)
Iron: 19 ug/dL — ABNORMAL LOW (ref 27–159)
Total Iron Binding Capacity: 497 ug/dL — ABNORMAL HIGH (ref 250–450)
UIBC: 478 ug/dL — ABNORMAL HIGH (ref 131–425)

## 2019-02-03 LAB — FERRITIN: Ferritin: 24 ng/mL (ref 15–150)

## 2019-02-03 LAB — CBC
Hematocrit: 30.7 % — ABNORMAL LOW (ref 34.0–46.6)
Hemoglobin: 8.3 g/dL — ABNORMAL LOW (ref 11.1–15.9)
MCH: 16.8 pg — ABNORMAL LOW (ref 26.6–33.0)
MCHC: 27 g/dL — ABNORMAL LOW (ref 31.5–35.7)
MCV: 62 fL — ABNORMAL LOW (ref 79–97)
Platelets: 311 10*3/uL (ref 150–450)
RBC: 4.94 x10E6/uL (ref 3.77–5.28)
RDW: 20.9 % — ABNORMAL HIGH (ref 11.7–15.4)
WBC: 8.9 10*3/uL (ref 3.4–10.8)

## 2019-02-03 LAB — FOLATE: Folate: 9.7 ng/mL (ref >3.0)

## 2019-02-09 ENCOUNTER — Telehealth: Payer: Self-pay | Admitting: *Deleted

## 2019-02-09 NOTE — Telephone Encounter (Addendum)
I called Medical Day at [phone] to schedule [facility] appointment and left message to please call our office back.  Jacques Navy

## 2019-02-09 NOTE — Telephone Encounter (Signed)
-----   Message from Aletha Halim, MD sent at 02/03/2019  9:15 AM EDT ----- Can y'all set her up for feraheme qwk x 2 doses? Orders are in. thanks

## 2019-02-11 NOTE — Telephone Encounter (Signed)
I called Medical day and scheduled for first available appointment for 02/17/19 at 0800. I called Rhonda Hoover and informed her of appointment and her hemoglobin. I explained she goes to [facility] hospital main entrance admitting then to medical day. I explained will be 2 treatments 1 week apart. She voices understanding. Linda,RN

## 2019-02-13 ENCOUNTER — Encounter (HOSPITAL_COMMUNITY): Payer: Self-pay

## 2019-02-13 ENCOUNTER — Ambulatory Visit (HOSPITAL_COMMUNITY): Payer: Medicaid Other | Admitting: *Deleted

## 2019-02-13 ENCOUNTER — Ambulatory Visit (HOSPITAL_COMMUNITY)
Admission: RE | Admit: 2019-02-13 | Discharge: 2019-02-13 | Disposition: A | Discharge status: Home / Self Care | Payer: Medicaid Other | Source: Ambulatory Visit | Attending: Obstetrics and Gynecology | Admitting: Obstetrics and Gynecology

## 2019-02-13 ENCOUNTER — Other Ambulatory Visit (HOSPITAL_COMMUNITY): Payer: Self-pay | Admitting: *Deleted

## 2019-02-13 ENCOUNTER — Other Ambulatory Visit: Payer: Self-pay

## 2019-02-13 VITALS — BP 124/69 | HR 103 | Temp 97.9°F

## 2019-02-13 DIAGNOSIS — D259 Leiomyoma of uterus, unspecified: Secondary | ICD-10-CM

## 2019-02-13 DIAGNOSIS — O3412 Maternal care for benign tumor of corpus uteri, second trimester: Secondary | ICD-10-CM

## 2019-02-13 DIAGNOSIS — O26872 Cervical shortening, second trimester: Secondary | ICD-10-CM | POA: Insufficient documentation

## 2019-02-13 DIAGNOSIS — O3432 Maternal care for cervical incompetence, second trimester: Secondary | ICD-10-CM | POA: Diagnosis present

## 2019-02-13 DIAGNOSIS — O34219 Maternal care for unspecified type scar from previous cesarean delivery: Secondary | ICD-10-CM

## 2019-02-13 DIAGNOSIS — O09292 Supervision of pregnancy with other poor reproductive or obstetric history, second trimester: Secondary | ICD-10-CM

## 2019-02-13 DIAGNOSIS — O43212 Placenta accreta, second trimester: Secondary | ICD-10-CM | POA: Diagnosis not present

## 2019-02-13 DIAGNOSIS — O09212 Supervision of pregnancy with history of pre-term labor, second trimester: Secondary | ICD-10-CM

## 2019-02-13 DIAGNOSIS — Z3A22 22 weeks gestation of pregnancy: Secondary | ICD-10-CM

## 2019-02-13 DIAGNOSIS — Z3686 Encounter for antenatal screening for cervical length: Secondary | ICD-10-CM

## 2019-02-13 DIAGNOSIS — O09219 Supervision of pregnancy with history of pre-term labor, unspecified trimester: Secondary | ICD-10-CM

## 2019-02-16 ENCOUNTER — Telehealth: Payer: Self-pay | Admitting: Family Medicine

## 2019-02-16 ENCOUNTER — Encounter: Payer: Self-pay | Admitting: Obstetrics & Gynecology

## 2019-02-16 ENCOUNTER — Encounter: Payer: Medicaid Other | Admitting: Family Medicine

## 2019-02-16 DIAGNOSIS — O43212 Placenta accreta, second trimester: Secondary | ICD-10-CM | POA: Insufficient documentation

## 2019-02-16 DIAGNOSIS — O4400 Placenta previa specified as without hemorrhage, unspecified trimester: Secondary | ICD-10-CM | POA: Insufficient documentation

## 2019-02-16 NOTE — Telephone Encounter (Signed)
Patient called to cancel her appointment this morning due to having a family  Emergency.

## 2019-02-17 ENCOUNTER — Other Ambulatory Visit: Payer: Self-pay

## 2019-02-17 ENCOUNTER — Ambulatory Visit (HOSPITAL_COMMUNITY)
Admission: RE | Admit: 2019-02-17 | Discharge: 2019-02-17 | Disposition: A | Discharge status: Home / Self Care | Payer: Medicaid Other | Source: Ambulatory Visit | Attending: Obstetrics and Gynecology | Admitting: Obstetrics and Gynecology

## 2019-02-17 DIAGNOSIS — O99012 Anemia complicating pregnancy, second trimester: Secondary | ICD-10-CM | POA: Diagnosis present

## 2019-02-17 DIAGNOSIS — Z3A00 Weeks of gestation of pregnancy not specified: Secondary | ICD-10-CM | POA: Diagnosis not present

## 2019-02-17 MED ORDER — SODIUM CHLORIDE 0.9 % IV SOLN
510.0000 mg | INTRAVENOUS | Status: DC
  Administered 2019-02-17: 510 mg via INTRAVENOUS
  Filled 2019-02-17: qty 17

## 2019-02-17 NOTE — Discharge Instructions (Signed)

## 2019-02-20 ENCOUNTER — Other Ambulatory Visit: Payer: Self-pay

## 2019-02-20 ENCOUNTER — Encounter (HOSPITAL_COMMUNITY): Payer: Self-pay | Admitting: *Deleted

## 2019-02-20 ENCOUNTER — Inpatient Hospital Stay (HOSPITAL_COMMUNITY)
Admission: AD | Admit: 2019-02-20 | Discharge: 2019-02-23 | DRG: 776 | Disposition: A | Discharge status: Home / Self Care | Payer: Medicaid Other | Attending: Obstetrics and Gynecology | Admitting: Obstetrics and Gynecology

## 2019-02-20 DIAGNOSIS — O43212 Placenta accreta, second trimester: Principal | ICD-10-CM | POA: Diagnosis present

## 2019-02-20 DIAGNOSIS — D649 Anemia, unspecified: Secondary | ICD-10-CM | POA: Diagnosis present

## 2019-02-20 DIAGNOSIS — O99012 Anemia complicating pregnancy, second trimester: Secondary | ICD-10-CM | POA: Diagnosis present

## 2019-02-20 DIAGNOSIS — O4400 Placenta previa specified as without hemorrhage, unspecified trimester: Secondary | ICD-10-CM | POA: Diagnosis present

## 2019-02-20 DIAGNOSIS — O26852 Spotting complicating pregnancy, second trimester: Secondary | ICD-10-CM | POA: Diagnosis present

## 2019-02-20 DIAGNOSIS — Z3A24 24 weeks gestation of pregnancy: Secondary | ICD-10-CM

## 2019-02-20 DIAGNOSIS — O3432 Maternal care for cervical incompetence, second trimester: Secondary | ICD-10-CM | POA: Diagnosis present

## 2019-02-20 DIAGNOSIS — Z20828 Contact with and (suspected) exposure to other viral communicable diseases: Secondary | ICD-10-CM | POA: Diagnosis present

## 2019-02-20 DIAGNOSIS — O4402 Placenta previa specified as without hemorrhage, second trimester: Secondary | ICD-10-CM | POA: Diagnosis not present

## 2019-02-20 MED ORDER — PRENATAL MULTIVITAMIN CH
1.0000 | ORAL_TABLET | Freq: Every day | ORAL | Status: DC
  Administered 2019-02-21 – 2019-02-23 (×3): 1 via ORAL
  Filled 2019-02-20 (×3): qty 1

## 2019-02-20 MED ORDER — CALCIUM CARBONATE ANTACID 500 MG PO CHEW: 2.0000 | CHEWABLE_TABLET | ORAL | Status: DC | PRN

## 2019-02-20 MED ORDER — SODIUM CHLORIDE 0.9 % IV SOLN: 250.0000 mL | INTRAVENOUS | Status: DC | PRN

## 2019-02-20 MED ORDER — ACETAMINOPHEN 325 MG PO TABS: 650.0000 mg | ORAL_TABLET | ORAL | Status: DC | PRN

## 2019-02-20 MED ORDER — DOCUSATE SODIUM 100 MG PO CAPS
100.0000 mg | ORAL_CAPSULE | Freq: Every day | ORAL | Status: DC
  Filled 2019-02-20 (×2): qty 1

## 2019-02-20 MED ORDER — PROGESTERONE MICRONIZED 200 MG PO CAPS
200.0000 mg | ORAL_CAPSULE | Freq: Every day | ORAL | Status: DC
  Administered 2019-02-21 – 2019-02-23 (×3): 200 mg via VAGINAL
  Filled 2019-02-20 (×3): qty 1

## 2019-02-20 MED ORDER — SODIUM CHLORIDE 0.9% FLUSH
3.0000 mL | Freq: Two times a day (BID) | INTRAVENOUS | Status: DC
  Administered 2019-02-21 – 2019-02-22 (×3): 3 mL via INTRAVENOUS

## 2019-02-20 MED ORDER — ZOLPIDEM TARTRATE 5 MG PO TABS: 5.0000 mg | ORAL_TABLET | Freq: Every evening | ORAL | Status: DC | PRN

## 2019-02-20 MED ORDER — SODIUM CHLORIDE 0.9% FLUSH: 3.0000 mL | INTRAVENOUS | Status: DC | PRN

## 2019-02-20 NOTE — H&P (Signed)
Sheneice Brockschmidt is a 33 y.o.G6P1223 female IUP 24 3/7 weeks presenting with episode of vaginal spotting @ 1400 today. Pt reports going to restroom and noted vaginal discharge streaked with blood. No frank vaginal bleeding noted. Had some cramps as well.  Presently reports no cramps, vaginal bleeding/spotting. + FM. She denies any recent intercourse  Prenatal course complicated by H/O incompetent cervix and PTD. She had an incarnated uterus @ 20 weeks which was reduced at [facility] followed by cerclage placement.  U/S on 02/13/19 good CL at 4 cm, with placenta previa and ? Placenta accreta. Plan at that time was to repeat U/S in 3 weeks and pending results MRI.   OB History    Gravida  6   Para  3   Term  1   Preterm  2   AB  2   Living  3     SAB  2   TAB  0   Ectopic  0   Multiple      Live Births  3          Past Medical History:  Diagnosis Date  . Medical history non-contributory    Past Surgical History:  Procedure Laterality Date  . CERVICAL CERCLAGE     with each pregnancy  . CERVICAL CERCLAGE N/A 01/26/2019   Procedure: CERCLAGE CERVICAL;  Surgeon: Mora Bellman, MD;  Location: [facility] LD ORS;  Service: Gynecology;  Laterality: N/A;  . CESAREAN SECTION     x2   Family History: family history includes Healthy in her father and mother. Social History:  reports that she has never smoked. She has never used smokeless tobacco. She reports that she does not drink alcohol or use drugs.     Review of Systems  Constitutional: Negative.   Respiratory: Negative.   Cardiovascular: Negative.   Gastrointestinal: Negative.   Genitourinary: Negative.    History   Blood pressure 138/75, pulse 82, temperature 98.5 F (36.9 C), temperature source Oral, resp. rate 16, height 5\' 7"  (1.702 m), weight 78 kg, last menstrual period 09/06/2018, SpO2 99 %, currently breastfeeding. Exam Physical Exam  Constitutional: She appears well-developed and well-nourished.  Cardiovascular:  Normal rate and regular rhythm.  Respiratory: Effort normal and breath sounds normal.  GI: Soft. Bowel sounds are normal.  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 Do not feel that [place] is indicated at this time. No frank vaginal bleeding or PTL. Will obtain wet prep for TOC d/t to H/O trich. Discuss with MFM need for MRI during this hospitalization or as planned previously. Will continue with Progesterone. POC reviewed with pt.    Chancy Milroy 02/20/2019, 11:32 PM

## 2019-02-20 NOTE — Plan of Care (Signed)

## 2019-02-21 ENCOUNTER — Inpatient Hospital Stay (HOSPITAL_COMMUNITY): Payer: Medicaid Other

## 2019-02-21 DIAGNOSIS — Z3A24 24 weeks gestation of pregnancy: Secondary | ICD-10-CM

## 2019-02-21 DIAGNOSIS — O4402 Placenta previa specified as without hemorrhage, second trimester: Secondary | ICD-10-CM

## 2019-02-21 LAB — COMPREHENSIVE METABOLIC PANEL
ALT: 44 U/L (ref 0–44)
AST: 45 U/L — ABNORMAL HIGH (ref 15–41)
Albumin: 2.6 g/dL — ABNORMAL LOW (ref 3.5–5.0)
Alkaline Phosphatase: 52 U/L (ref 38–126)
Anion gap: 9 (ref 5–15)
BUN: <5 mg/dL — ABNORMAL LOW (ref 6–20)
CO2: 21 mmol/L — ABNORMAL LOW (ref 22–32)
Calcium: 8.5 mg/dL — ABNORMAL LOW (ref 8.9–10.3)
Chloride: 105 mmol/L (ref 98–111)
Creatinine, Ser: 0.63 mg/dL (ref 0.44–1.00)
GFR calc Af Amer: >60 mL/min (ref >60)
GFR calc non Af Amer: >60 mL/min (ref >60)
Glucose, Bld: 123 mg/dL — ABNORMAL HIGH (ref 70–99)
Potassium: 3.5 mmol/L (ref 3.5–5.1)
Sodium: 135 mmol/L (ref 135–145)
Total Bilirubin: 0.3 mg/dL (ref 0.3–1.2)
Total Protein: 6 g/dL — ABNORMAL LOW (ref 6.5–8.1)

## 2019-02-21 LAB — CBC
HCT: 27.2 % — ABNORMAL LOW (ref 36.0–46.0)
Hemoglobin: 7.8 g/dL — ABNORMAL LOW (ref 12.0–15.0)
MCH: 18.5 pg — ABNORMAL LOW (ref 26.0–34.0)
MCHC: 28.7 g/dL — ABNORMAL LOW (ref 30.0–36.0)
MCV: 64.5 fL — ABNORMAL LOW (ref 80.0–100.0)
Platelets: 271 10*3/uL (ref 150–400)
RBC: 4.22 MIL/uL (ref 3.87–5.11)
RDW: 23.2 % — ABNORMAL HIGH (ref 11.5–15.5)
WBC: 11.1 10*3/uL — ABNORMAL HIGH (ref 4.0–10.5)
nRBC: 0.2 % (ref 0.0–0.2)

## 2019-02-21 LAB — WET PREP, GENITAL
Sperm: NONE SEEN
Yeast Wet Prep HPF POC: NONE SEEN

## 2019-02-21 LAB — PREPARE RBC (CROSSMATCH)

## 2019-02-21 LAB — SARS CORONAVIRUS 2 BY RT PCR (HOSPITAL ORDER, PERFORMED IN ~~LOC~~ HOSPITAL LAB): SARS Coronavirus 2: NEGATIVE

## 2019-02-21 MED ORDER — SODIUM CHLORIDE 0.9% IV SOLUTION: Freq: Once | INTRAVENOUS | Status: DC

## 2019-02-21 MED ORDER — METRONIDAZOLE 500 MG PO TABS
500.0000 mg | ORAL_TABLET | Freq: Two times a day (BID) | ORAL | Status: DC
  Administered 2019-02-21 – 2019-02-23 (×5): 500 mg via ORAL
  Filled 2019-02-21 (×5): qty 1

## 2019-02-21 MED ORDER — BETAMETHASONE SOD PHOS & ACET 6 (3-3) MG/ML IJ SUSP
12.0000 mg | Freq: Once | INTRAMUSCULAR | Status: AC
  Administered 2019-02-21: 12 mg via INTRAMUSCULAR
  Filled 2019-02-21: qty 2

## 2019-02-21 NOTE — Progress Notes (Signed)
Pt refused Covid test 

## 2019-02-21 NOTE — Progress Notes (Signed)
Patient ID: Rhonda Hoover, female   DOB: [birth year], 33 y.o.   MRN: [id number] [place] COMPREHENSIVE PROGRESS NOTE  Rhonda Hoover is a 33 y.o. [id number] at [redacted]w[redacted]d  who is admitted for vaginal spotting in presents of placenta previa and cerclage.   Fetal presentation is breech. U/S 02/13/19 Length of Stay:  1  Days  Subjective: No complaints this morning. Tolerating diet Patient reports good fetal movement.  She reports no uterine contractions, no bleeding and no loss of fluid per vagina.  Vitals: AF VSS  Physical Examination: Lungs clear Heart RRR Abd soft + BS gravid non tender GU deferred  Ext non tender   Fetal Monitoring:  140-150's, + accles, no decels ot ut ctx  Labs:  Results for orders placed or performed during the hospital encounter of 02/20/19 (from the past 24 hour(s))  Comprehensive metabolic panel   Collection Time: 02/21/19 12:14 AM  Result Value Ref Range   Sodium 135 135 - 145 mmol/L   Potassium 3.5 3.5 - 5.1 mmol/L   Chloride 105 98 - 111 mmol/L   CO2 21 (L) 22 - 32 mmol/L   Glucose, Bld 123 (H) 70 - 99 mg/dL   BUN <5 (L) 6 - 20 mg/dL   Creatinine, Ser 0.63 0.44 - 1.00 mg/dL   Calcium 8.5 (L) 8.9 - 10.3 mg/dL   Total Protein 6.0 (L) 6.5 - 8.1 g/dL   Albumin 2.6 (L) 3.5 - 5.0 g/dL   AST 45 (H) 15 - 41 U/L   ALT 44 0 - 44 U/L   Alkaline Phosphatase 52 38 - 126 U/L   Total Bilirubin 0.3 0.3 - 1.2 mg/dL   GFR calc non Af Amer >60 >60 mL/min   GFR calc Af Amer >60 >60 mL/min   Anion gap 9 5 - 15  CBC on admission   Collection Time: 02/21/19 12:14 AM  Result Value Ref Range   WBC 11.1 (H) 4.0 - 10.5 K/uL   RBC 4.22 3.87 - 5.11 MIL/uL   Hemoglobin 7.8 (L) 12.0 - 15.0 g/dL   HCT 27.2 (L) 36.0 - 46.0 %   MCV 64.5 (L) 80.0 - 100.0 fL   MCH 18.5 (L) 26.0 - 34.0 pg   MCHC 28.7 (L) 30.0 - 36.0 g/dL   RDW 23.2 (H) 11.5 - 15.5 %   Platelets 271 150 - 400 K/uL   nRBC 0.2 0.0 - 0.2 %  Type and screen [place]   Collection Time:  02/21/19 12:14 AM  Result Value Ref Range   ABO/RH(D) B POS    Antibody Screen NEG    Sample Expiration      02/24/2019,2359 Performed at [facility], [address], [place], [place] [address]   Wet prep, genital   Collection Time: 02/21/19 12:24 AM  Result Value Ref Range   Yeast Wet Prep HPF POC NONE SEEN NONE SEEN   Trich, Wet Prep PRESENT (A) NONE SEEN   Clue Cells Wet Prep HPF POC PRESENT (A) NONE SEEN   WBC, Wet Prep HPF POC MANY (A) NONE SEEN   Sperm NONE SEEN     Imaging Studies:    NA   Medications:  Scheduled . docusate sodium  100 mg Oral Daily  . metroNIDAZOLE  500 mg Oral Q12H  . prenatal multivitamin  1 tablet Oral Q1200  . progesterone  200 mg Vaginal Daily  . sodium chloride flush  3 mL Intravenous Q12H   I have reviewed the  patient's current medications.  ASSESSMENT: IUP 24 4/7 weeks Vaginal spotting, resolved Placenta previa ? 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 No spotting since admission. No evidence of labor. Will start Flagyl for trich. ? Cause of spotting.    Continue routine antenatal care.   Chancy Milroy 02/21/2019,7:35 AM

## 2019-02-21 NOTE — Progress Notes (Signed)
Patient ID: Rhonda Hoover, female   DOB: 1985/08/21, 33 y.o.   MRN: [id number] HD #2  S. She has not seen any further bleeding. She reports good FM, denies ROM, contractions.  O. VSS, AF FHR 150s reassuring. While I was speaking with her the monitor started to show the heart rate in the 90s. I used an u/s to confirm that the FHR was still in the 150s. No contractions noted on toco. Abd- benign, gravid  A/P. Episode of bleeding at [id number] weeks with certain previa and possible acreta. I spoke with Dr. Morey Hummingbird and have ordered BMZ, MRI, and will transfuse her 1 unit of PRBCs.  I have discussed the need for c/s for delivery and hysterectomy if an acreta is present. Risks, benefits explained, understood, and accepted. She consented and her husband was the witness.

## 2019-02-22 LAB — TYPE AND SCREEN
ABO/RH(D): B POS
Antibody Screen: NEGATIVE
Unit division: 0

## 2019-02-22 LAB — BPAM RBC
Blood Product Expiration Date: 202011042359
ISSUE DATE / TIME: 202010101112
Unit Type and Rh: 7300

## 2019-02-22 LAB — COMPREHENSIVE METABOLIC PANEL
ALT: 55 U/L — ABNORMAL HIGH (ref 0–44)
AST: 47 U/L — ABNORMAL HIGH (ref 15–41)
Albumin: 2.8 g/dL — ABNORMAL LOW (ref 3.5–5.0)
Alkaline Phosphatase: 59 U/L (ref 38–126)
Anion gap: 10 (ref 5–15)
BUN: 7 mg/dL (ref 6–20)
CO2: 19 mmol/L — ABNORMAL LOW (ref 22–32)
Calcium: 8.9 mg/dL (ref 8.9–10.3)
Chloride: 106 mmol/L (ref 98–111)
Creatinine, Ser: 0.53 mg/dL (ref 0.44–1.00)
GFR calc Af Amer: >60 mL/min (ref >60)
GFR calc non Af Amer: >60 mL/min (ref >60)
Glucose, Bld: 119 mg/dL — ABNORMAL HIGH (ref 70–99)
Potassium: 4.1 mmol/L (ref 3.5–5.1)
Sodium: 135 mmol/L (ref 135–145)
Total Bilirubin: 0.4 mg/dL (ref 0.3–1.2)
Total Protein: 6.7 g/dL (ref 6.5–8.1)

## 2019-02-22 MED ORDER — BETAMETHASONE SOD PHOS & ACET 6 (3-3) MG/ML IJ SUSP
12.0000 mg | Freq: Once | INTRAMUSCULAR | Status: AC
  Administered 2019-02-22: 12 mg via INTRAMUSCULAR
  Filled 2019-02-22: qty 2

## 2019-02-22 NOTE — Progress Notes (Addendum)
Patient ID: Rhonda Hoover, female   DOB: [birth year], 33 y.o.   MRN: [id number]  HD #3  S. She denies further bleeding, wants to go home asap. She reports good FM, no ROM or contractions.  O. VSS, AF FHR- reassuring CTX- none  MRI results pending  A/P. Bleeding in pregnnacy at 24.[redacted] weeks EGA I told her that we generally keep patients for 5-7 days who have had bleeding in pregnancy. She tells me that the doctor that admitted her said that he "doesn't really count this as bleeding", more related to her trich.  Previa/possible acreta- MRI done but results pending - I consented her yesterday for a cesarean section, possible hysterectomy  H/O PTD- s/p cerclage and daily progesterone  Anemia- now s/p transfusion of 1 unit PRBC yesterday and fereheme infusion x 1.  I will discuss this case with Dr. Harolyn Rutherford who will be covering the service for the next 24 hours. I would be inclined to keep her in the hospital for 5-7 days.

## 2019-02-22 NOTE — Progress Notes (Signed)
Patient refused IV as she is leaving tomorrow AMA.

## 2019-02-22 NOTE — Progress Notes (Signed)
CSW received consult for patient due to her needing to stay at the hospital for several more days but has four children at home to care for. CSW met with patient at bedside to discuss current situation. Patient reports that on Friday she went to the bathroom to urinate and after wiping she noticed a very faint amount of "pink blood" and decided to come to [facility] to get checked out. MOB reports she has not had any blood or spotting since. MOB reports her children are ages 52, 53, 17, and 76 years old. MOB reports her husband works at a retirement facility in [place] and that he must report to work Architectural technologist. CSW and MOB discussed the risks of her leaving AMA but MOB stated she would absolutely stay if she believed it were necessary, but at this time she needs to be home to care for her other children. MOB was pleasant, well spoken, and involved in conversation throughout CSW interaction. MOB reports she is receiving prenatal care at [facility] and her next appointment is 03/02/2019. MOB denies any pain, concerns, or questions at this time. MOB expressed gratitude for CSW advocacy on her behalf.   CSW spoke with patient's RN Janett Billow to discuss information above.   Madilyn Fireman, MSW, LCSW-A Transitions of Care  Clinical Social Worker  [facility] [employer] [phone]

## 2019-02-22 NOTE — Progress Notes (Signed)
Dr. Harolyn Rutherford notified of patients concerns regarding the need to stay in the hospital for observation for vaginal bleeding during pregnancy but also still needing to care for her 4 children at home and not having support for them while she is admitted. Patient has stated she cannot stay past tomorrow morning. Dr. Harolyn Rutherford now aware. No changes made to plan of care.

## 2019-02-23 ENCOUNTER — Telehealth: Payer: Self-pay

## 2019-02-23 DIAGNOSIS — O43219 Placenta accreta, unspecified trimester: Secondary | ICD-10-CM

## 2019-02-23 LAB — COMPREHENSIVE METABOLIC PANEL
ALT: 51 U/L — ABNORMAL HIGH (ref 0–44)
AST: 37 U/L (ref 15–41)
Albumin: 3 g/dL — ABNORMAL LOW (ref 3.5–5.0)
Alkaline Phosphatase: 59 U/L (ref 38–126)
Anion gap: 10 (ref 5–15)
BUN: 6 mg/dL (ref 6–20)
CO2: 21 mmol/L — ABNORMAL LOW (ref 22–32)
Calcium: 9 mg/dL (ref 8.9–10.3)
Chloride: 106 mmol/L (ref 98–111)
Creatinine, Ser: 0.65 mg/dL (ref 0.44–1.00)
GFR calc Af Amer: >60 mL/min (ref >60)
GFR calc non Af Amer: >60 mL/min (ref >60)
Glucose, Bld: 91 mg/dL (ref 70–99)
Potassium: 3.7 mmol/L (ref 3.5–5.1)
Sodium: 137 mmol/L (ref 135–145)
Total Bilirubin: 0.2 mg/dL — ABNORMAL LOW (ref 0.3–1.2)
Total Protein: 6.9 g/dL (ref 6.5–8.1)

## 2019-02-23 LAB — HEPATITIS PANEL, ACUTE
HCV Ab: NONREACTIVE
Hep A IgM: NONREACTIVE
Hep B C IgM: NONREACTIVE
Hepatitis B Surface Ag: NONREACTIVE

## 2019-02-23 LAB — PROTEIN / CREATININE RATIO, URINE
Creatinine, Urine: 78.8 mg/dL
Protein Creatinine Ratio: 0.08 mg/mg{Cre} (ref 0.00–0.15)
Total Protein, Urine: 6 mg/dL

## 2019-02-23 MED ORDER — METRONIDAZOLE 500 MG PO TABS: 500.0000 mg | ORAL_TABLET | Freq: Two times a day (BID) | ORAL | 0 refills | Status: AC

## 2019-02-23 NOTE — Progress Notes (Addendum)
Discharge AMA form signed by patient. Instructions reviewed for patient to return to [facility] if VB, contractions, decreased FM, or abdominal pain. Medications sent to pharmacy by MD, and follow up appointment already scheduled. Patient verbalizes understanding of home care.

## 2019-02-23 NOTE — Discharge Summary (Signed)
Patient ID: Rhonda Hoover MRN: [id number] DOB/AGE: 1985-05-20 33 y.o.  Admit date: 02/20/2019 Discharge date: 02/23/2019  Admission Diagnoses: IUP 24 weeks, placenta previa, incompetent cervix with cerclage, vaginal bleeding  Discharge Diagnoses: SAA, undelivered plus placenta accreta  Prenatal Procedures: NST and pelvic MRI  Consults:  Maternal Fetal Medicine  Hospital Course:  This is a 33 y.o. [id number] with IUP at [redacted]w[redacted]d admitted for third trimester vaginal bleeding. Additional Dx as noted above. She was observed and had no further bleeding during admission. There was a concern for placenta accreta, MFM consulted and MRI was ordered. MRI confirmed placenta accreta. Dx was reviewed with pt. Order for consult/transfer to [place] as out pt was sent to clinic. Pt verbalized understanding. She did received BMZ x 2. She was Dx with trich and placed on Flagyl. She was noted have increased LFT's with repeat showing normal and trending downward.    She was observed, fetal heart rate monitoring remained reassuring. On Monday February 23, 2019 informed us that she would need to leave due to childcare issues. I spoke to pt and informed her that, she would need to sign AMA form. Also that leaving could be live threaten for her and her unborn child. She verbalized understanding. Signed the form and left the hospital AMA.    Discharge Exam: Temp:  [97.9 F (36.6 C)-98.6 F (37 C)] 98.4 F (36.9 C) (10/12 1140) Pulse Rate:  [73-84] 80 (10/12 1140) Resp:  [17-20] 18 (10/12 1140) BP: (127-148)/(68-75) 143/71 (10/12 1140) SpO2:  [98 %-100 %] 98 % (10/12 1140)   Physical Examination: Lungs clear Heart RRR Abd soft + BS gravid non tender GU deferred Ext non tender  Fetal monitoring: 140-150's + accels, no decels or ut ctx  Significant Diagnostic Studies:  Results for orders placed or performed during the hospital encounter of 02/20/19 (from the past 168 hour(s))  Comprehensive metabolic  panel   Collection Time: 02/21/19 12:14 AM  Result Value Ref Range   Sodium 135 135 - 145 mmol/L   Potassium 3.5 3.5 - 5.1 mmol/L   Chloride 105 98 - 111 mmol/L   CO2 21 (L) 22 - 32 mmol/L   Glucose, Bld 123 (H) 70 - 99 mg/dL   BUN <5 (L) 6 - 20 mg/dL   Creatinine, Ser 0.63 0.44 - 1.00 mg/dL   Calcium 8.5 (L) 8.9 - 10.3 mg/dL   Total Protein 6.0 (L) 6.5 - 8.1 g/dL   Albumin 2.6 (L) 3.5 - 5.0 g/dL   AST 45 (H) 15 - 41 U/L   ALT 44 0 - 44 U/L   Alkaline Phosphatase 52 38 - 126 U/L   Total Bilirubin 0.3 0.3 - 1.2 mg/dL   GFR calc non Af Amer >60 >60 mL/min   GFR calc Af Amer >60 >60 mL/min   Anion gap 9 5 - 15  CBC on admission   Collection Time: 02/21/19 12:14 AM  Result Value Ref Range   WBC 11.1 (H) 4.0 - 10.5 K/uL   RBC 4.22 3.87 - 5.11 MIL/uL   Hemoglobin 7.8 (L) 12.0 - 15.0 g/dL   HCT 27.2 (L) 36.0 - 46.0 %   MCV 64.5 (L) 80.0 - 100.0 fL   MCH 18.5 (L) 26.0 - 34.0 pg   MCHC 28.7 (L) 30.0 - 36.0 g/dL   RDW 23.2 (H) 11.5 - 15.5 %   Platelets 271 150 - 400 K/uL   nRBC 0.2 0.0 - 0.2 %  Type and screen [place]   Collection Time: 02/21/19 12:14 AM  Result Value Ref Range   ABO/RH(D) B POS    Antibody Screen NEG    Sample Expiration 02/24/2019,2359    Unit Number [id number]    Blood Component Type RED CELLS,LR    Unit division 00    Status of Unit ISSUED,FINAL    Transfusion Status OK TO TRANSFUSE    Crossmatch Result      Compatible Performed at [facility], [place] [address] [place], [place] [address]   BPAM [facility]   Collection Time: 02/21/19 12:14 AM  Result Value Ref Range   ISSUE DATE / TIME [id number]    Blood Product Unit Number [id number]    PRODUCT CODE [id number]    Unit Type and Rh 7300    Blood Product Expiration Date [id number]   Wet prep, genital   Collection Time: 02/21/19 12:24 AM  Result Value Ref Range   Yeast Wet Prep HPF POC NONE SEEN NONE SEEN   Trich, Wet Prep PRESENT (A) NONE SEEN   Clue Cells Wet Prep HPF  POC PRESENT (A) NONE SEEN   WBC, Wet Prep HPF POC MANY (A) NONE SEEN   Sperm NONE SEEN   Prepare RBC   Collection Time: 02/21/19  9:23 AM  Result Value Ref Range   Order Confirmation      ORDER PROCESSED BY BLOOD BANK Performed at [facility], [place] [address], [place], [place] [address]   SARS Coronavirus 2 by RT PCR (hospital order, performed in [facility] hospital lab) Nasopharyngeal Nasopharyngeal Swab   Collection Time: 02/21/19  9:24 AM   Specimen: Nasopharyngeal Swab  Result Value Ref Range   SARS Coronavirus 2 NEGATIVE NEGATIVE  Comprehensive metabolic panel   Collection Time: 02/22/19  5:39 AM  Result Value Ref Range   Sodium 135 135 - 145 mmol/L   Potassium 4.1 3.5 - 5.1 mmol/L   Chloride 106 98 - 111 mmol/L   CO2 19 (L) 22 - 32 mmol/L   Glucose, Bld 119 (H) 70 - 99 mg/dL   BUN 7 6 - 20 mg/dL   Creatinine, Ser 0.53 0.44 - 1.00 mg/dL   Calcium 8.9 8.9 - 10.3 mg/dL   Total Protein 6.7 6.5 - 8.1 g/dL   Albumin 2.8 (L) 3.5 - 5.0 g/dL   AST 47 (H) 15 - 41 U/L   ALT 55 (H) 0 - 44 U/L   Alkaline Phosphatase 59 38 - 126 U/L   Total Bilirubin 0.4 0.3 - 1.2 mg/dL   GFR calc non Af Amer >60 >60 mL/min   GFR calc Af Amer >60 >60 mL/min   Anion gap 10 5 - 15  Comprehensive metabolic panel   Collection Time: 02/23/19 12:23 PM  Result Value Ref Range   Sodium 137 135 - 145 mmol/L   Potassium 3.7 3.5 - 5.1 mmol/L   Chloride 106 98 - 111 mmol/L   CO2 21 (L) 22 - 32 mmol/L   Glucose, Bld 91 70 - 99 mg/dL   BUN 6 6 - 20 mg/dL   Creatinine, Ser 0.65 0.44 - 1.00 mg/dL   Calcium 9.0 8.9 - 10.3 mg/dL   Total Protein 6.9 6.5 - 8.1 g/dL   Albumin 3.0 (L) 3.5 - 5.0 g/dL   AST 37 15 - 41 U/L   ALT 51 (H) 0 - 44 U/L   Alkaline Phosphatase 59 38 - 126 U/L   Total Bilirubin 0.2 (L) 0.3 - 1.2 mg/dL  GFR calc non Af Amer >60 >60 mL/min   GFR calc Af Amer >60 >60 mL/min   Anion gap 10 5 - 15  Protein / creatinine ratio, urine   Collection Time: 02/23/19 12:57 PM  Result Value  Ref Range   Creatinine, Urine 78.80 mg/dL   Total Protein, Urine 6 mg/dL   Protein Creatinine Ratio 0.08 0.00 - 0.15 mg/mg[Cre]    Discharge Condition: Stable  Disposition: Discharge disposition: 07-Left Against Medical Advice/Left Without Being Seen/Elopement        Discharge Instructions    Discharge activity:  Bathroom / Shower only   Complete by: As directed    Discharge diet:  No restrictions   Complete by: As directed    Discharge instructions   Complete by: As directed    Continue with pelvic rest. Go to the nearest hospital for any vaginal bleeding or possible labor   Do not have sex or do anything that might make you have an orgasm   Complete by: As directed    Fetal Kick Count:  Lie on our left side for one hour after a meal, and count the number of times your baby kicks.  If it is less than 5 times, get up, move around and drink some juice.  Repeat the test 30 minutes later.  If it is still less than 5 kicks in an hour, notify your doctor.   Complete by: As directed    Notify physician for a general feeling that "something is not right"   Complete by: As directed    Notify physician for increase or change in vaginal discharge   Complete by: As directed    Notify physician for intestinal cramps, with or without diarrhea, sometimes described as "gas pain"   Complete by: As directed    Notify physician for leaking of fluid   Complete by: As directed    Notify physician for low, dull backache, unrelieved by heat or Tylenol   Complete by: As directed    Notify physician for menstrual like cramps   Complete by: As directed    Notify physician for pelvic pressure   Complete by: As directed    Notify physician for uterine contractions.  These may be painless and feel like the uterus is tightening or the baby is  "balling up"   Complete by: As directed    Notify physician for vaginal bleeding   Complete by: As directed    PRETERM LABOR:  Includes any of the follwing  symptoms that occur between 20 - [redacted] weeks gestation.  If these symptoms are not stopped, preterm labor can result in preterm delivery, placing your baby at risk   Complete by: As directed      Allergies as of 02/23/2019      Reactions   Penicillins Rash   Childhood allergy Did it involve swelling of the face/tongue/throat, SOB, or low BP? No Did it involve sudden or severe rash/hives, skin peeling, or any reaction on the inside of your mouth or nose? Yes Did you need to seek medical attention at a hospital or doctor's office? Yes When did it last happen?childhood reaction If all above answers are "NO", may proceed with cephalosporin use.      Medication List    TAKE these medications   CRANBERRY/PROBIOTIC PO Take 1 capsule by mouth every evening.   IRON PO Take 7.5 mLs by mouth daily.   metroNIDAZOLE 500 MG tablet Commonly known as: FLAGYL Take 1 tablet (500 mg  total) by mouth every 12 (twelve) hours.   prenatal multivitamin Tabs tablet Take 1 tablet by mouth every evening.   progesterone 200 MG capsule Commonly known as: PROMETRIUM Place 1 capsule (200 mg total) vaginally daily. What changed: when to take this      [place] for [facility] Follow up.   Specialty: Obstetrics and Gynecology Why: Pt already has app for 03/02/2019 Contact information: [address] [place], [place] [id number] [place] [phone]          Signed: Chancy Milroy M.D. 02/23/2019, 2:16 PM

## 2019-02-23 NOTE — Progress Notes (Signed)
Checked in on [place] who had a significant amount of stress due to childcare.  She is feeling better knowing that she has a short term plan and can work on a longer term plan in case she needs to be hospitalized again.  [place], Bcc Pager, [phone] 2:45 PM

## 2019-02-23 NOTE — Telephone Encounter (Signed)
-----   Message from Chancy Milroy, MD sent at 02/23/2019 11:55 AM EDT ----- Please schedule a consult/referral to MFM at [facility] for placenta accreta. Thanks Legrand Como

## 2019-02-23 NOTE — Discharge Instructions (Signed)
Placenta Previa Placenta previa is a condition in which the placenta implants in the lower part of the uterus in pregnant women. The placenta either partially or completely covers the opening to the cervix. This is a problem because the baby must pass through the cervix during delivery. There are three types of placenta previa:  Marginal placenta previa. The placenta reaches within an inch (2.5 cm) of the cervical opening but does not cover it.  Partial placenta previa. The placenta covers part of the cervical opening.  Complete placenta previa. The placenta covers the entire cervical opening. If the previa is marginal or partial and it is diagnosed in the first half of pregnancy, the placenta may move into a normal position as the pregnancy progresses and may no longer cover the cervix. It is important to keep all prenatal visits with your health care provider so you can be more closely monitored. What are the causes? The cause of this condition is not known. What increases the risk? This condition is more likely to develop in women who:  Are carrying more than one baby (multiples).  Have an abnormally shaped uterus.  Have scars on the lining of the uterus.  Have had surgeries involving the uterus, such as a cesarean delivery.  Have delivered a baby before.  Have a history of placenta previa.  Have smoked or used cocaine during pregnancy.  Are age 34 or older during pregnancy. What are the signs or symptoms? The main symptom of this condition is sudden, painless vaginal bleeding during the second half of pregnancy. The amount of bleeding can be very light at first, and it usually stops on its own. Heavier bleeding episodes may also happen. Some women with placenta previa may have no bleeding at all. How is this diagnosed?  This condition is diagnosed: ? From an ultrasound. This test uses sound waves to find where the placenta is located before you have any bleeding  episodes. ? During a checkup after vaginal bleeding is noticed.  If you are diagnosed with a partial or complete previa, digital exams with fingers will generally be avoided. Your health care provider will still perform a speculum exam.  If you did not have an ultrasound during your pregnancy, placenta previa may not be diagnosed until bleeding occurs during labor. How is this treated? Treatment for this condition may include:  Decreased activity.  Bed rest at home or in the hospital.  Pelvic rest. Nothing is placed inside the vagina during pelvic rest. This means not having sex and not using tampons or douches.  A blood transfusion to replace blood that you have lost (maternal blood loss).  A cesarean delivery. This may be performed if: ? The bleeding is heavy and cannot be controlled. ? The placenta completely covers the cervix.  Medicines to stop premature labor or to help the baby's lungs to mature. This treatment may be used if you need delivery before your pregnancy is full-term. Your treatment will be decided based on:  How much you are bleeding, or whether the bleeding has stopped.  How far along you are in your pregnancy.  The condition of your baby.  The type of placenta previa that you have.  Follow these instructions at home:  Get plenty of rest and lessen activity as told by your health care provider.  Stay on bed rest for as long as told by your health care provider.  Do not have sex, use tampons, use a douche, or place anything inside of  your vagina if your health care provider recommended pelvic rest.  Take over-the-counter and prescription medicines as told by your health care provider.  Keep all follow-up visits as told by your health care provider. This is important. Get help right away if:  You have vaginal bleeding, even if in small amounts and even if you have no pain.  You have cramping or regular contractions.  You have pain in your abdomen or  your lower back.  You have a feeling of increased pressure in your pelvis.  You have increased watery or bloody mucus from the vagina. This information is not intended to replace advice given to you by your health care provider. Make sure you discuss any questions you have with your health care provider. Document Released: 04/30/2005 Document Revised: 04/12/2017 Document Reviewed: 11/12/2015 Elsevier Patient Education  2020 [employer].

## 2019-02-23 NOTE — Telephone Encounter (Signed)
Referral ordered to [facility] per Dr. Rip Harbour. Called to schedule an appt. Spoke with admin staff member who states she will send a message to nurses to schedule pt's appt. Notified her pt is 24w 6d today and needs a referral for placenta accreta. Staff member states a nurse will call our office back with an appt time.   Will send message to front office to fax order and office notes as requested.

## 2019-02-24 ENCOUNTER — Inpatient Hospital Stay (HOSPITAL_COMMUNITY)
Admission: RE | Admit: 2019-02-24 | Discharge: 2019-02-24 | Disposition: A | Discharge status: Home / Self Care | Payer: Medicaid Other | Source: Ambulatory Visit | Attending: Obstetrics and Gynecology | Admitting: Obstetrics and Gynecology

## 2019-02-24 NOTE — Telephone Encounter (Signed)
Comprehensive fetal care nurse called office. Pt has appt scheduled for 10/14 @ 0915. Pt has been notified.

## 2019-03-02 ENCOUNTER — Ambulatory Visit (INDEPENDENT_AMBULATORY_CARE_PROVIDER_SITE_OTHER): Payer: Medicaid Other | Admitting: Family Medicine

## 2019-03-02 ENCOUNTER — Encounter: Payer: Self-pay | Admitting: Family Medicine

## 2019-03-02 ENCOUNTER — Other Ambulatory Visit (HOSPITAL_COMMUNITY)
Admission: RE | Admit: 2019-03-02 | Discharge: 2019-03-02 | Disposition: A | Discharge status: Home / Self Care | Payer: Medicaid Other | Source: Ambulatory Visit | Attending: Family Medicine | Admitting: Family Medicine

## 2019-03-02 ENCOUNTER — Other Ambulatory Visit: Payer: Self-pay

## 2019-03-02 ENCOUNTER — Other Ambulatory Visit: Payer: Medicaid Other

## 2019-03-02 VITALS — BP 129/80 | HR 112 | Wt 179.0 lb

## 2019-03-02 DIAGNOSIS — A5901 Trichomonal vulvovaginitis: Secondary | ICD-10-CM | POA: Insufficient documentation

## 2019-03-02 DIAGNOSIS — O0990 Supervision of high risk pregnancy, unspecified, unspecified trimester: Secondary | ICD-10-CM

## 2019-03-02 DIAGNOSIS — O4402 Placenta previa specified as without hemorrhage, second trimester: Secondary | ICD-10-CM

## 2019-03-02 DIAGNOSIS — O09892 Supervision of other high risk pregnancies, second trimester: Secondary | ICD-10-CM

## 2019-03-02 DIAGNOSIS — O23592 Infection of other part of genital tract in pregnancy, second trimester: Secondary | ICD-10-CM | POA: Insufficient documentation

## 2019-03-02 DIAGNOSIS — Z3A25 25 weeks gestation of pregnancy: Secondary | ICD-10-CM

## 2019-03-02 DIAGNOSIS — O3432 Maternal care for cervical incompetence, second trimester: Secondary | ICD-10-CM

## 2019-03-02 DIAGNOSIS — Z98891 History of uterine scar from previous surgery: Secondary | ICD-10-CM

## 2019-03-02 DIAGNOSIS — O09899 Supervision of other high risk pregnancies, unspecified trimester: Secondary | ICD-10-CM

## 2019-03-02 DIAGNOSIS — O0992 Supervision of high risk pregnancy, unspecified, second trimester: Secondary | ICD-10-CM

## 2019-03-02 DIAGNOSIS — O3430 Maternal care for cervical incompetence, unspecified trimester: Secondary | ICD-10-CM

## 2019-03-02 DIAGNOSIS — O34512 Maternal care for incarceration of gravid uterus, second trimester: Secondary | ICD-10-CM

## 2019-03-02 DIAGNOSIS — O43212 Placenta accreta, second trimester: Secondary | ICD-10-CM

## 2019-03-02 NOTE — Patient Instructions (Signed)
Preterm Labor and Birth Information °Pregnancy normally lasts 39-41 weeks. Preterm labor is when labor starts early. It starts before you have been pregnant for 37 whole weeks. °What are the risk factors for preterm labor? °Preterm labor is more likely to occur in women who: °· Have an infection while pregnant. °· Have a cervix that is short. °· Have gone into preterm labor before. °· Have had surgery on their cervix. °· Are younger than age 34. °· Are older than age 35. °· Are African American. °· Are pregnant with two or more babies. °· Take street drugs while pregnant. °· Smoke while pregnant. °· Do not gain enough weight while pregnant. °· Got pregnant right after another pregnancy. °What are the symptoms of preterm labor? °Symptoms of preterm labor include: °· Cramps. The cramps may feel like the cramps some women get during their period. The cramps may happen with watery poop (diarrhea). °· Pain in the belly (abdomen). °· Pain in the lower back. °· Regular contractions or tightening. It may feel like your belly is getting tighter. °· Pressure in the lower belly that seems to get stronger. °· More fluid (discharge) leaking from the vagina. The fluid may be watery or bloody. °· Water breaking. °Why is it important to notice signs of preterm labor? °Babies who are born early may not be fully developed. They have a higher chance for: °· Long-term heart problems. °· Long-term lung problems. °· Trouble controlling body systems, like breathing. °· Bleeding in the brain. °· A condition called cerebral palsy. °· Learning difficulties. °· Death. °These risks are highest for babies who are born before 34 weeks of pregnancy. °How is preterm labor treated? °Treatment depends on: °· How long you were pregnant. °· Your condition. °· The health of your baby. °Treatment may involve: °· Having a stitch (suture) placed in your cervix. When you give birth, your cervix opens so the baby can come out. The stitch keeps the cervix  from opening too soon. °· Staying at the hospital. °· Taking or getting medicines, such as: °? Hormone medicines. °? Medicines to stop contractions. °? Medicines to help the baby’s lungs develop. °? Medicines to prevent your baby from having cerebral palsy. °What should I do if I am in preterm labor? °If you think you are going into labor too soon, call your doctor right away. °How can I prevent preterm labor? °· Do not use any tobacco products. °? Examples of these are cigarettes, chewing tobacco, and e-cigarettes. °? If you need help quitting, ask your doctor. °· Do not use street drugs. °· Do not use any medicines unless you ask your doctor if they are safe for you. °· Talk with your doctor before taking any herbal supplements. °· Make sure you gain enough weight. °· Watch for infection. If you think you might have an infection, get it checked right away. °· If you have gone into preterm labor before, tell your doctor. °This information is not intended to replace advice given to you by your health care provider. Make sure you discuss any questions you have with your health care provider. °Document Released: 07/27/2008 Document Revised: 08/22/2018 Document Reviewed: 09/21/2015 °Elsevier Patient Education © 2020 Elsevier Inc. ° °

## 2019-03-02 NOTE — Progress Notes (Signed)
Subjective:  Latigra Mikrut is a 33 y.o. [id number] at [redacted]w[redacted]d being seen today for ongoing prenatal care.  She is currently monitored for the following issues for this high-risk pregnancy and has History of preterm delivery, currently pregnant; Supervision of high risk pregnancy, antepartum; Incompetent cervix in pregnancy, antepartum; Fibroid; History of Incarcerated gravid uterus; Cervical cerclage suture present; Trichomonal vaginitis during pregnancy; Alpha thalassemia trait; History of classical cesarean section; Anemia affecting pregnancy, antepartum; Placenta accreta, second trimester; and Placenta previa (anterior) on their problem list.  Patient reports no complaints.  Contractions: Not present. Vag. Bleeding: None.  Movement: Present. Denies leaking of fluid. No vaginal bleeding since leaving the hospital AMA.  The following portions of the patient's history were reviewed and updated as appropriate: allergies, current medications, past family history, past medical history, past social history, past surgical history and problem list. Problem list updated.  Objective:   Vitals:   03/02/19 0923  BP: 129/80  Pulse: (!) 112  Weight: 179 lb (81.2 kg)    Fetal Status: Fetal Heart Rate (bpm): 154 Fundal Height: 26 cm Movement: Present     General:  Alert, oriented and cooperative. Patient is in no acute distress.  Skin: Skin is warm and dry. No rash noted.   Cardiovascular: Normal heart rate noted  Respiratory: Normal respiratory effort, no problems with respiration noted  Abdomen: Soft, gravid, appropriate for gestational age. Pain/Pressure: Absent     Pelvic: Vag. Bleeding: None     Cervical exam deferred        Extremities: Normal range of motion.  Edema: None  Mental Status: Normal mood and affect. Normal behavior. Normal judgment and thought content.    Assessment and Plan:  Pregnancy: [id number] at [redacted]w[redacted]d 1. Supervision of high risk pregnancy, antepartum - Cervicovaginal ancillary only(  [place])  2. History of preterm delivery, currently pregnant -preterm labor symptoms discussed  3. Incompetent cervix in pregnancy, antepartum -cerclage in place  4. 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 Cervical cerclage suture present in second trimester -cerclage in place  6. Trichomonal vaginitis during pregnancy in second trimester - completed Flagyl - Cervicovaginal ancillary only( [place])  7. History of classical cesarean section -will need repeat CS at [facility] due to placenta acreeta  8. Placenta accreta, second trimester - referral sent to [facility]  9. Placenta previa in second trimester - no episodes of bleeding since leaving the hospital AMA  There are no diagnoses linked to this encounter. Preterm labor symptoms and general obstetric precautions including but not limited to vaginal bleeding, contractions, leaking of fluid and fetal movement were reviewed in detail with the patient. Please refer to After Visit Summary for other counseling recommendations.  Return for 2 weeks HOB as scheduled.   Dayson Aboud L, DO

## 2019-03-03 ENCOUNTER — Other Ambulatory Visit: Payer: Self-pay | Admitting: Obstetrics and Gynecology

## 2019-03-03 LAB — CBC
Hematocrit: 36.3 % (ref 34.0–46.6)
Hemoglobin: 10.7 g/dL — ABNORMAL LOW (ref 11.1–15.9)
MCH: 20.3 pg — ABNORMAL LOW (ref 26.6–33.0)
MCHC: 29.5 g/dL — ABNORMAL LOW (ref 31.5–35.7)
MCV: 69 fL — ABNORMAL LOW (ref 79–97)
Platelets: 204 10*3/uL (ref 150–450)
RBC: 5.26 x10E6/uL (ref 3.77–5.28)
RDW: 30.8 % — ABNORMAL HIGH (ref 11.7–15.4)
WBC: 10.8 10*3/uL (ref 3.4–10.8)

## 2019-03-03 LAB — HIV ANTIBODY (ROUTINE TESTING W REFLEX): HIV Screen 4th Generation wRfx: NONREACTIVE

## 2019-03-03 LAB — GLUCOSE TOLERANCE, 2 HOURS W/ 1HR
Glucose, 1 hour: 145 mg/dL (ref 65–179)
Glucose, 2 hour: 72 mg/dL (ref 65–152)
Glucose, Fasting: 87 mg/dL (ref 65–91)

## 2019-03-03 LAB — RPR: RPR Ser Ql: NONREACTIVE

## 2019-03-03 MED ORDER — DOCUSATE SODIUM 100 MG PO CAPS: 100.0000 mg | ORAL_CAPSULE | Freq: Two times a day (BID) | ORAL | 2 refills | Status: AC | PRN

## 2019-03-03 MED ORDER — FERROUS GLUCONATE 324 (38 FE) MG PO TABS: 324.0000 mg | ORAL_TABLET | Freq: Two times a day (BID) | ORAL | 3 refills | Status: AC

## 2019-03-05 LAB — CERVICOVAGINAL ANCILLARY ONLY
Comment: NEGATIVE
Trichomonas: POSITIVE — AB

## 2019-03-06 ENCOUNTER — Encounter (HOSPITAL_COMMUNITY): Payer: Self-pay

## 2019-03-06 ENCOUNTER — Ambulatory Visit (HOSPITAL_COMMUNITY): Payer: Medicaid Other

## 2019-03-07 MED ORDER — METRONIDAZOLE 500 MG PO TABS: 500.0000 mg | ORAL_TABLET | Freq: Two times a day (BID) | ORAL | 0 refills | Status: AC

## 2019-03-07 NOTE — Addendum Note (Signed)
Addended by: Vidal Schwalbe on: 03/07/2019 05:57 AM   Modules accepted: Orders

## 2019-03-09 ENCOUNTER — Telehealth: Payer: Self-pay

## 2019-03-09 NOTE — Telephone Encounter (Addendum)
-----   Message from Merilyn Baba, DO sent at 03/07/2019  5:57 AM EDT ----- Positive for Trich again, script for Flagyl sent. Advise TOC 4 weeks after tx finished, her partner should get treated as well  Attempted pt call pt unable to LVM due to VM box full.

## 2019-03-10 MED ORDER — TINIDAZOLE 500 MG PO TABS: 2.0000 g | ORAL_TABLET | Freq: Once | ORAL | 0 refills | Status: AC

## 2019-03-10 NOTE — Telephone Encounter (Signed)
Called Rhonda Hoover and and Rhonda Hoover informed that she has tested positive again for Trich.  Rhonda Hoover stated that she did not understand why she still had it because she has been taking Flagyl since she was [redacted] weeks pregnant and she is on pelvic rest.  Looking at Rhonda Hoover's chart Rhonda Hoover has had several doses of Flagyl.  I explained to the Rhonda Hoover that she may have built up a sensitivity to the Flagyl for it to treat the Trich.  Informed Rhonda Hoover that I can prescribe Tindamax per protocol to see if that will treat it and then see her back in 3 weeks for a TOC.  Verified with Elmyra Ricks, NP treatment of Tindamax.  Provider agreed with recommendation.  Rhonda Hoover verbalized understanding with no further questions.   [place] office sent a message to call and schedule Rhonda Hoover for TOC in 3 weeks.   Mel Almond, RN 03/10/19

## 2019-03-16 ENCOUNTER — Encounter: Payer: Medicaid Other | Admitting: Obstetrics and Gynecology

## 2019-03-30 ENCOUNTER — Encounter: Payer: Medicaid Other | Admitting: Obstetrics and Gynecology

## 2019-04-06 ENCOUNTER — Encounter: Payer: Medicaid Other | Admitting: Family Medicine

## 2019-04-16 MED ORDER — DIGNITY FREE & ACTIVE LARGE MISC
1.00 | Status: DC
Start: 2019-04-15 — End: 2019-04-16

## 2019-04-16 MED ORDER — Medication
10.00 | Status: DC
Start: 2019-04-14 — End: 2019-04-16

## 2019-04-16 MED ORDER — Medication
10.00 | Status: DC
Start: ? — End: 2019-04-16

## 2019-04-16 MED ORDER — Medication
Status: DC
Start: ? — End: 2019-04-16

## 2019-04-16 MED ORDER — QUINERVA 260 MG PO TABS
650.00 | ORAL_TABLET | ORAL | Status: DC
Start: ? — End: 2019-04-16

## 2019-04-16 MED ORDER — GUAIFENESIN 600 MG OR TBCR
500.00 | EXTENDED_RELEASE_TABLET | ORAL | Status: DC
Start: 2019-04-14 — End: 2019-04-16

## 2019-04-16 MED ORDER — METRISET BURETTE SET MISC
500.00 | Status: DC
Start: ? — End: 2019-04-16

## 2019-04-16 MED ORDER — PRO HERBS ENERGY PO TABS
20.00 | ORAL_TABLET | ORAL | Status: DC
Start: ? — End: 2019-04-16

## 2019-06-13 ENCOUNTER — Inpatient Hospital Stay (HOSPITAL_COMMUNITY): Admission: RE | Admit: 2019-06-13 | Payer: Medicaid Other | Source: Home / Self Care

## 2020-11-30 IMAGING — US US OB LIMITED
1 series · 15 of 22 positions shown · non-contrast
Comparison: none

[Series 1: us ob limited · 15 of 22 slices shown]
[im 1/22]
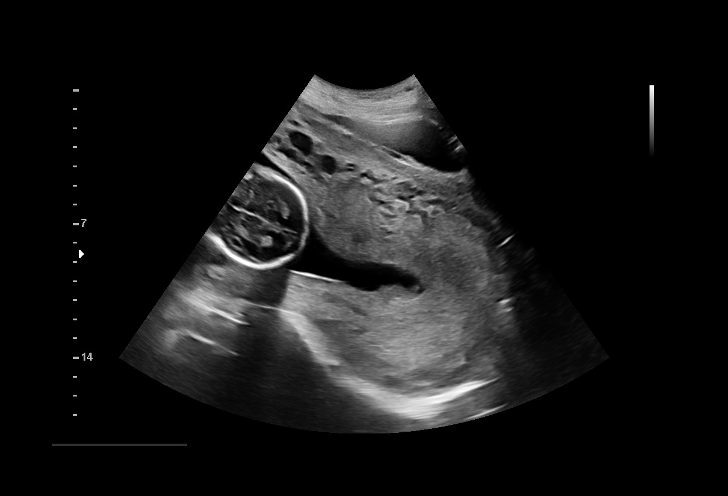
[im 3/22]
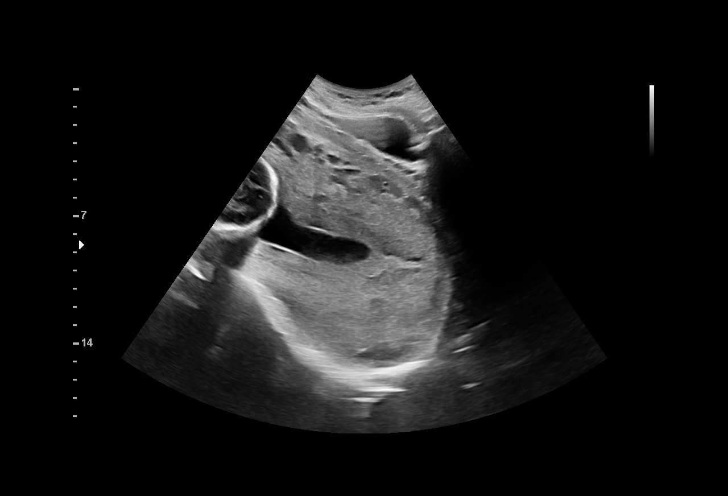
[im 4/22]
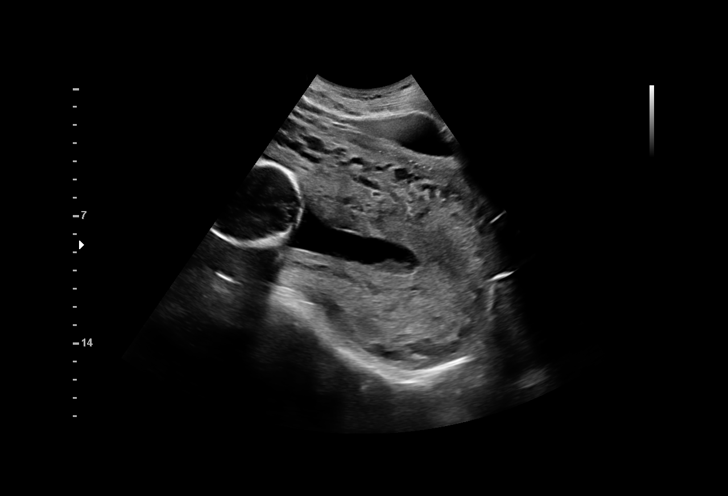
[im 6/22]
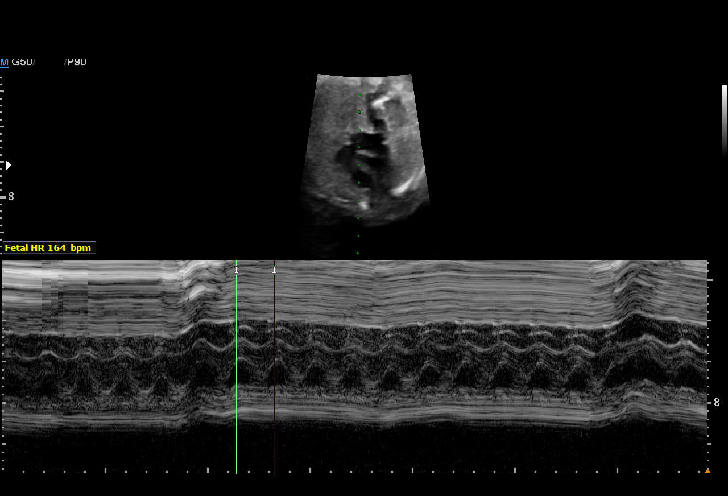
[im 7/22]
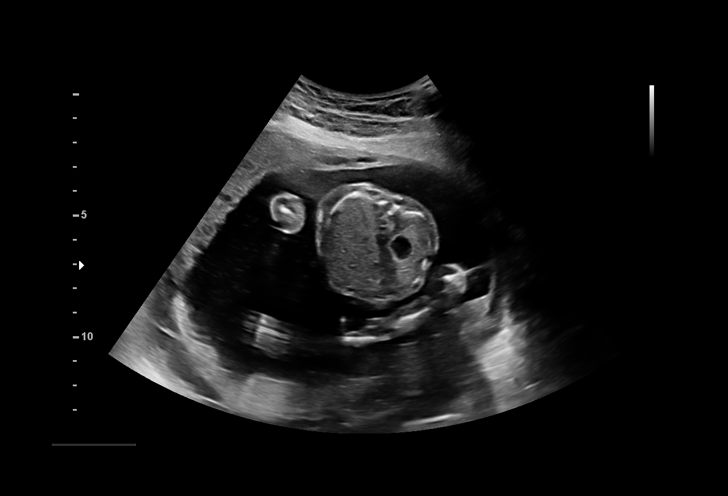
[im 9/22]
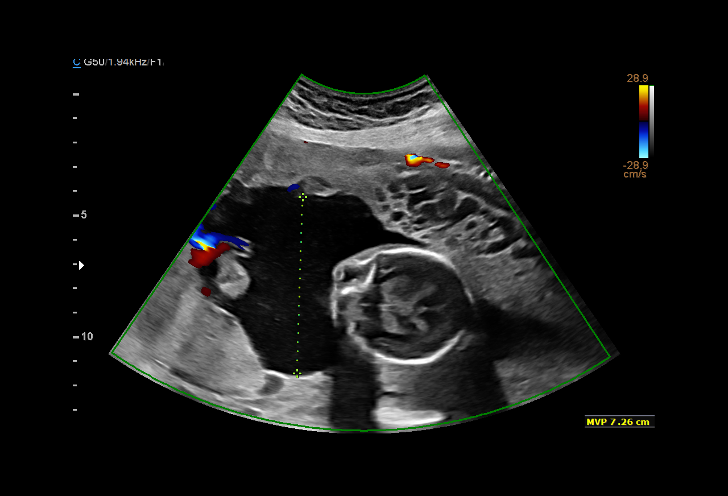
[im 10/22]
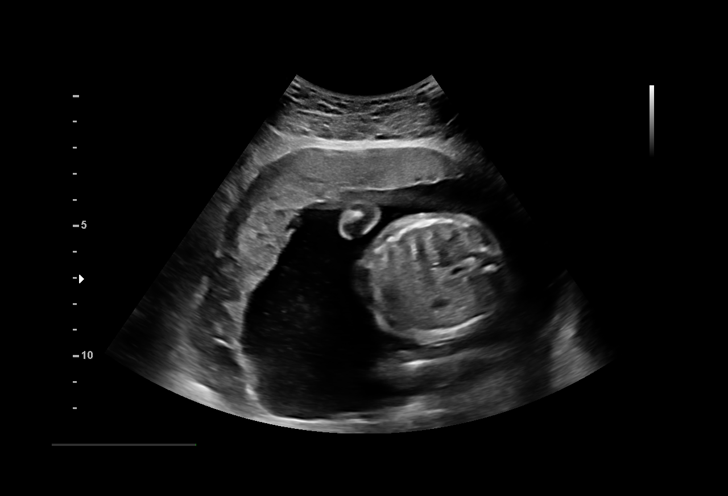
[im 12/22]
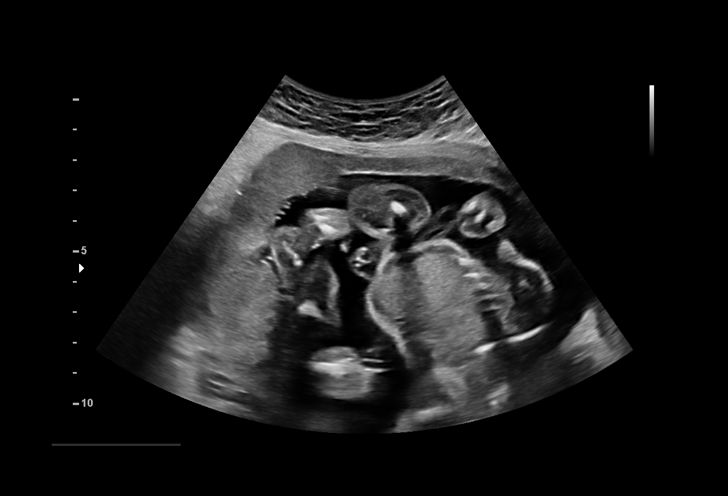
[im 13/22]
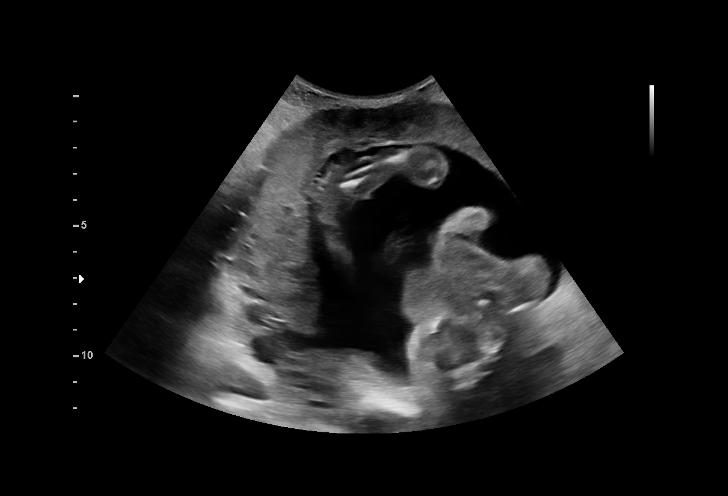
[im 14/22]
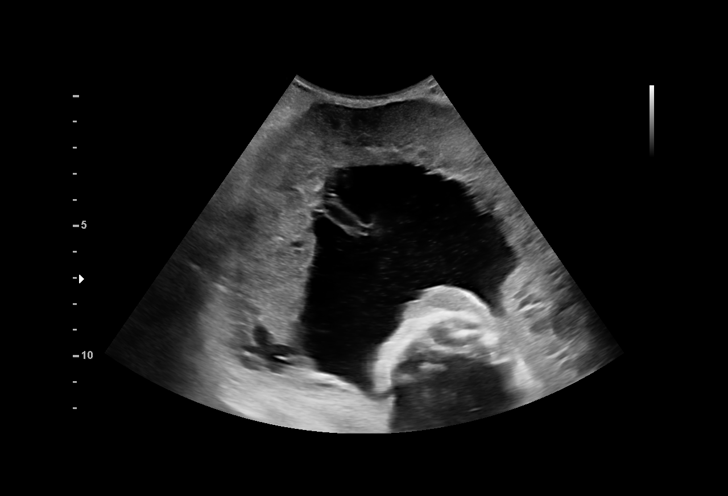
[im 16/22]
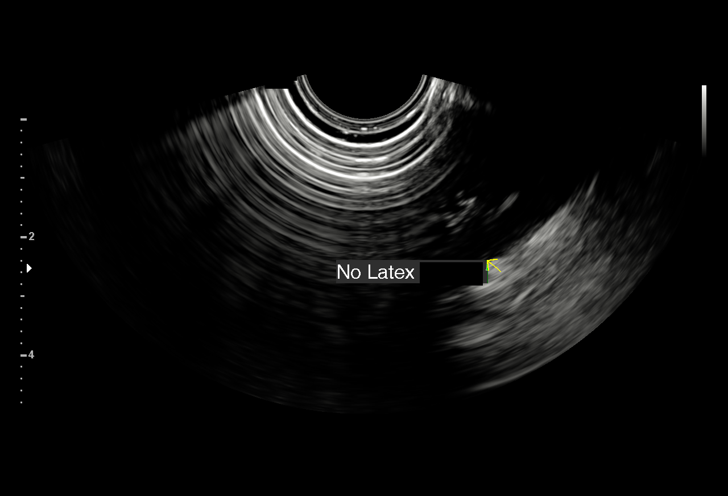
[im 17/22]
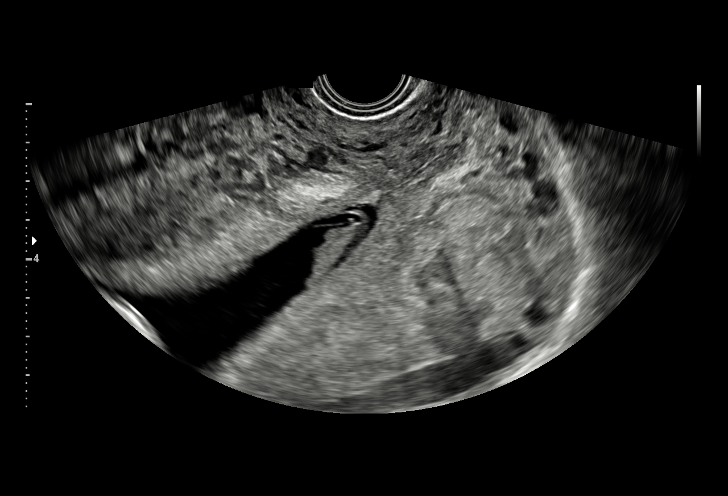
[im 19/22]
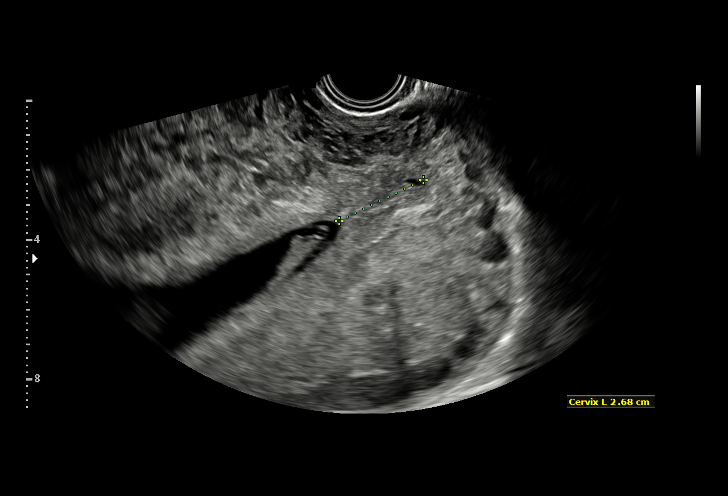
[im 20/22]
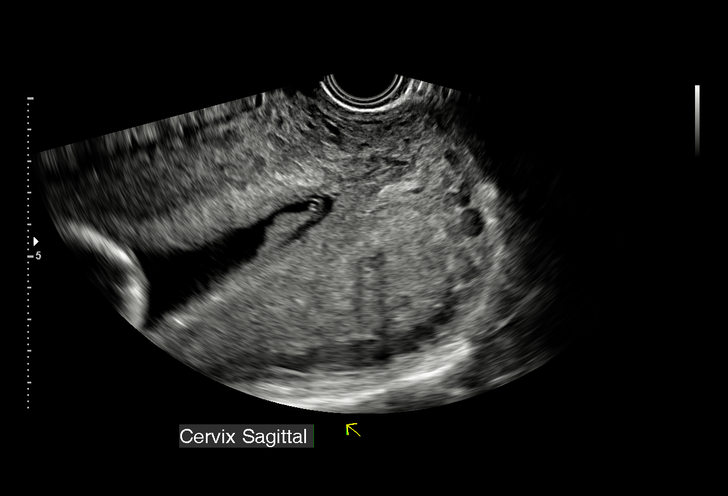
[im 22/22]
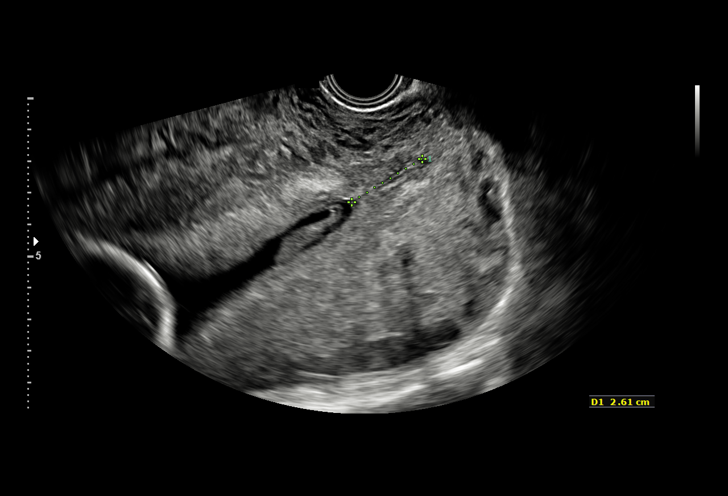

[15 of 22 positions shown; findings below may reference images not displayed]

Referred By:      ZARINA SASAKI

  1  US OB TRANSVAGINAL                   76817.0      NAJY TIGER
  2  [HOSPITAL]                        76815.0      NAJY TIGER
 ----------------------------------------------------------------------

 ----------------------------------------------------------------------
Indications

  Encounter for cervical length
  Pelvic pain affecting pregnancy in second
  trimester (cramping and pelvic pressure)
  19 weeks gestation of pregnancy
  Poor obstetric history: Previous preterm
  delivery, antepartum (x2)
  History of cesarean delivery, currently
  pregnant (x2)
  History of cervical cerclage, currently
  pregnant (x2)
 ----------------------------------------------------------------------
Vital Signs

 BMI:
Fetal Evaluation

 Num Of Fetuses:         1
 Fetal Heart Rate(bpm):  164
 Cardiac Activity:       Observed
 Presentation:           Cephalic
 Placenta:               Anterior

 Amniotic Fluid
 AFI FV:      Within normal limits

                             Largest Pocket(cm)

OB History

 Gravidity:    6         Term:   1        Prem:   2        SAB:   2
 Living:       3
Gestational Age

 LMP:           19w 3d        Date:  09/06/18                 EDD:   06/13/19
 Best:          19w 3d     Det. By:  LMP  (09/06/18)          EDD:   06/13/19
Cervix Uterus Adnexa

 Cervix
 Length:            2.6  cm.
 Measured transvaginally. Funneling of internal os noted.  Debris/clot
 noted at internal os.
Impression

 Cervical length
Recommendations

 Follow up CL in 1 week

## 2021-01-01 IMAGING — MR MR PELVIS W/O CM
6 of 8 series · 25 of 48 positions shown · non-contrast
Comparison: Outside 02/13/2019 obstetric ultrasound. 01/26/2019 MRI
pelvis.

CLINICAL DATA: Inpatient. 33-year-old pregnant female at 24 weeks
gestational age with a history of cesarean delivery x2 and cervical
cerclage, with uterine incarceration requiring uterine repositioning
[facility] at [facility]], with placenta previa
and suspected placenta accreta on prior MFM ultrasound, now
presenting with episode of vaginal bleeding.

EXAM:
MRI PELVIS WITHOUT CONTRAST
TECHNIQUE: Multiplanar multisequence MR imaging of the pelvis was performed. No
intravenous contrast was administered.

[Series 3: bSSFP · axial · 6.0mm · 0.55mm/px · z∈[-167,+193]mm · 2 of 51 slices shown]
[im 1/51]
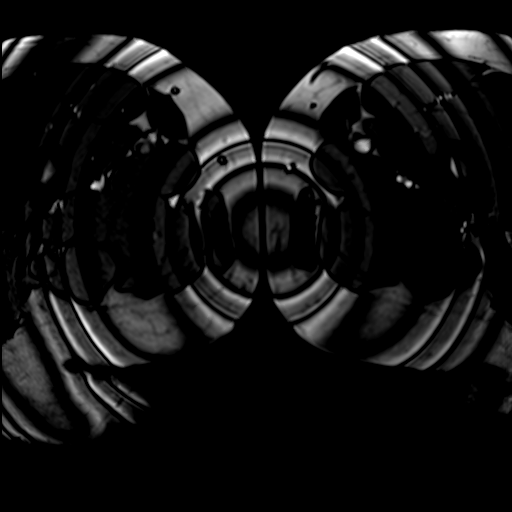
[im 51/51]
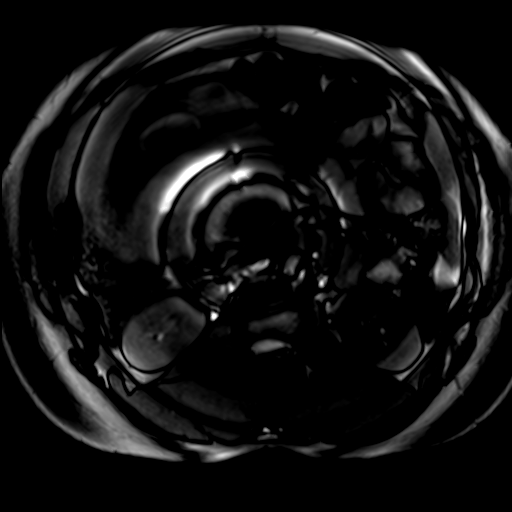

[Series 4: T2 · axial · 5.0mm · 0.88mm/px · z∈[-163,+197]mm · 2 of 61 slices shown (1 of 2)]
[im 1/61]
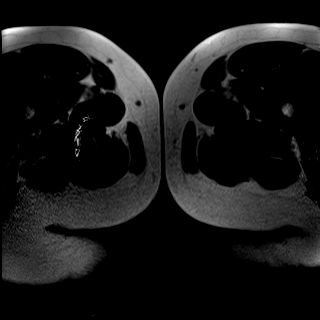
[im 61/61]
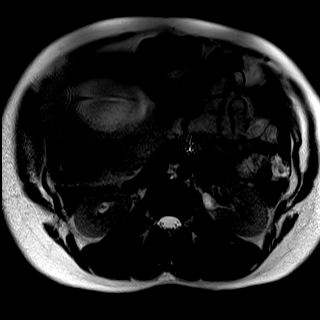

[Series 5: T2 · sagittal · 4.0mm · 1.00mm/px · 2 of 55 slices shown (2 of 2)]
[im 1/55]
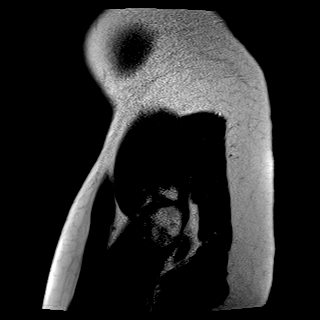
[im 55/55]
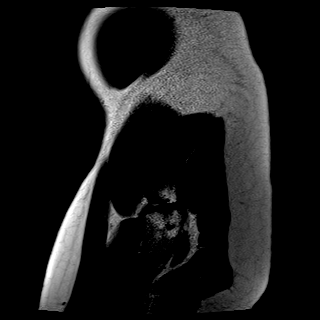

[Series 8: T1 dynamic · axial · 1.4mm · 0.78mm/px · z∈[-33,+168]mm · 6 of 144 slices shown (1 of 3)]
[im 1/144]
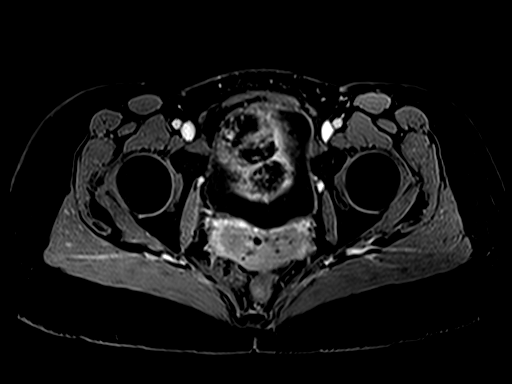
[im 29/144]
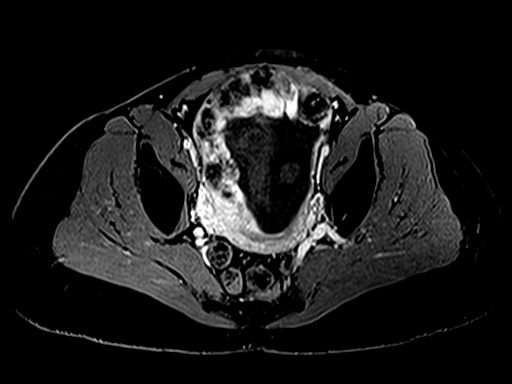
[im 58/144]
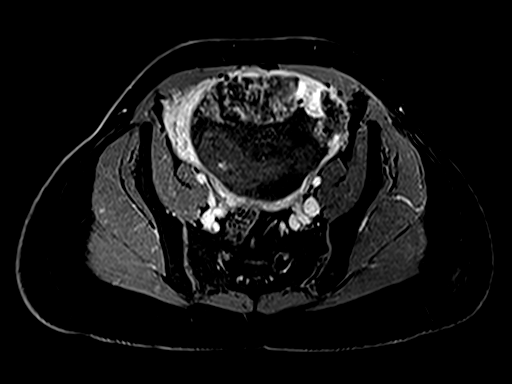
[im 86/144]
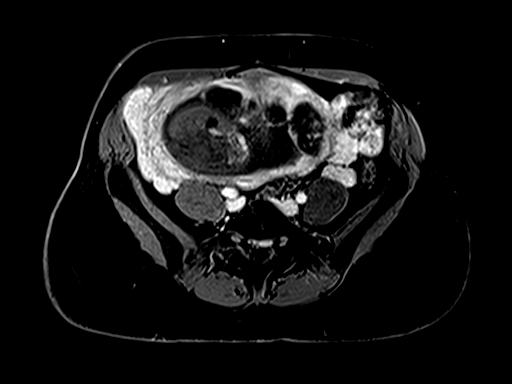
[im 115/144]
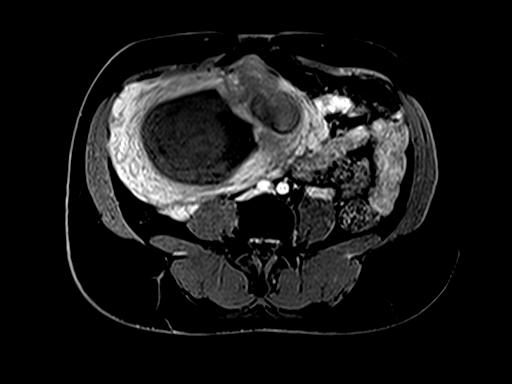
[im 144/144]
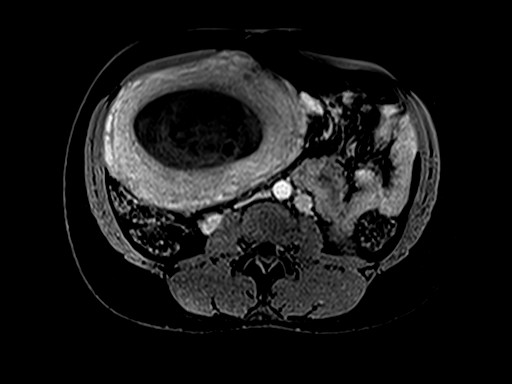

[Series 10: T1 dynamic · axial · 1.4mm · 0.78mm/px · z∈[-158,+42]mm · 6 of 144 slices shown (2 of 3)]
[im 1/144]
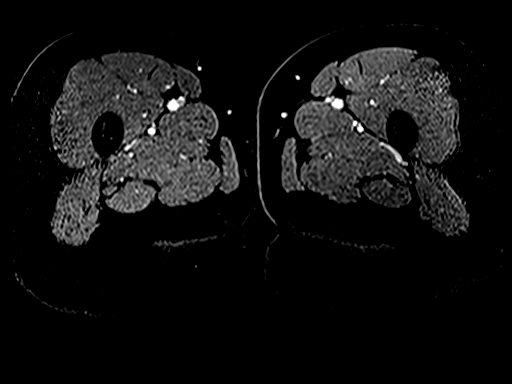
[im 29/144]
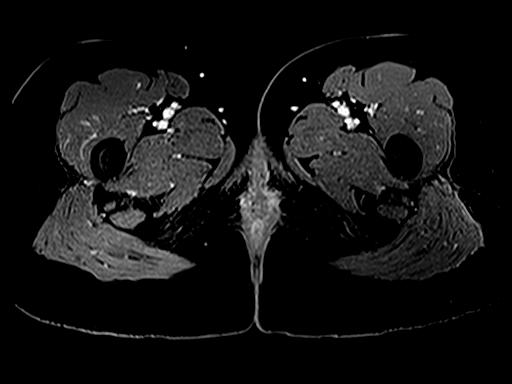
[im 58/144]
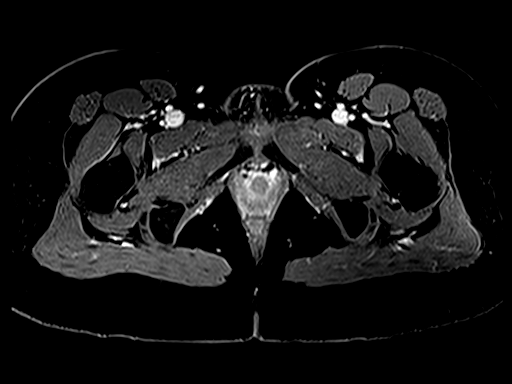
[im 86/144]
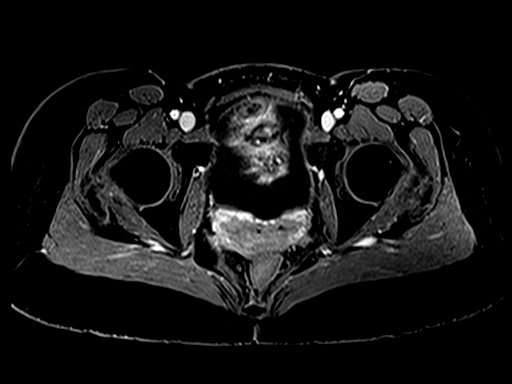
[im 115/144]
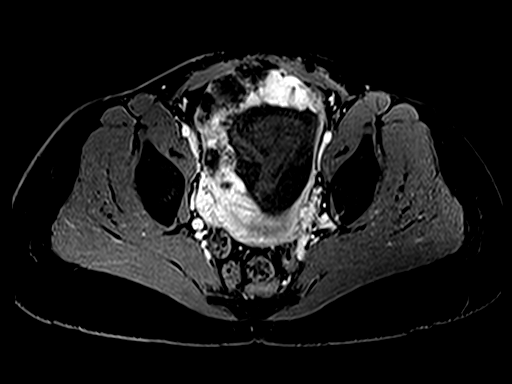
[im 144/144]
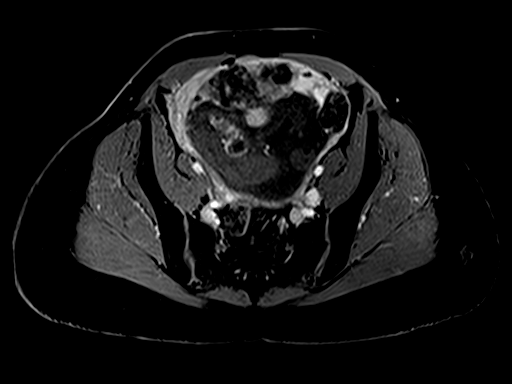

[Series 11: T1 dynamic · axial · 1.4mm · 0.78mm/px · z∈[-33,+131]mm · 7 of 288 slices shown (3 of 3)]
[im 1/288]
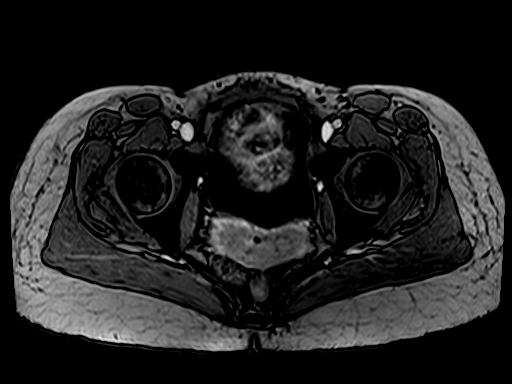
[im 53/288]
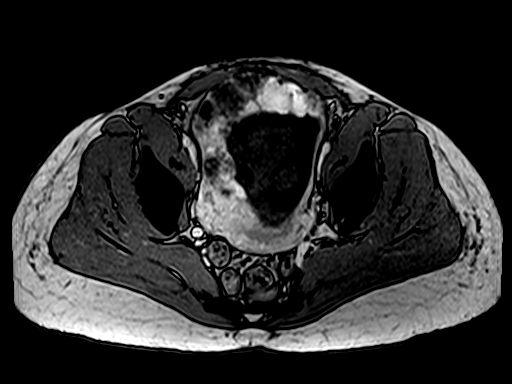
[im 79/288]
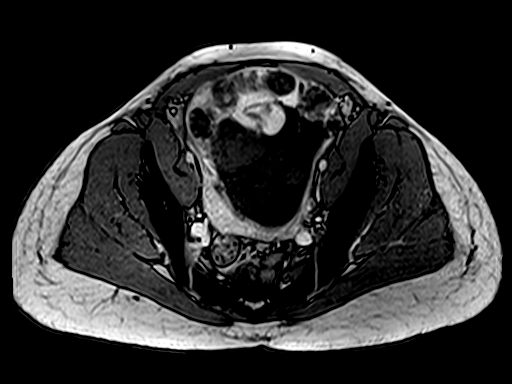
[im 131/288]
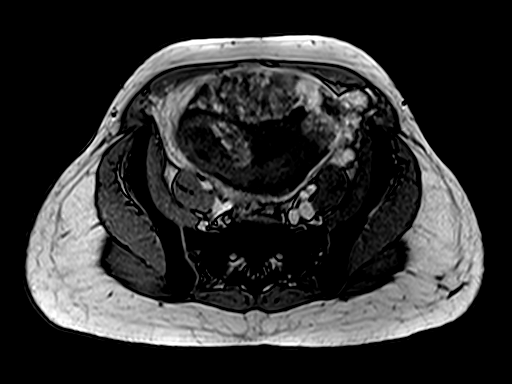
[im 157/288]
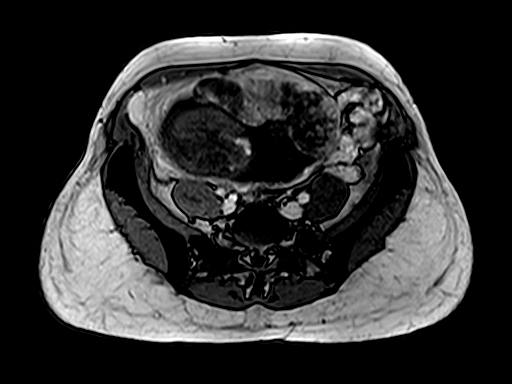
[im 209/288]
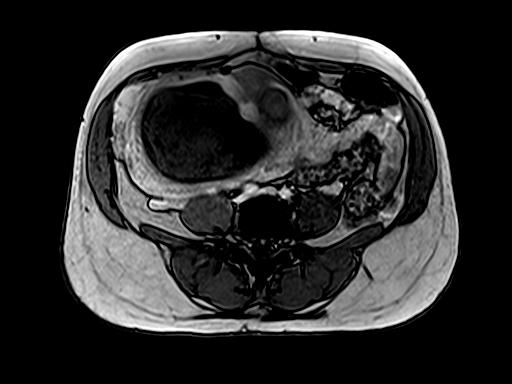
[im 235/288]
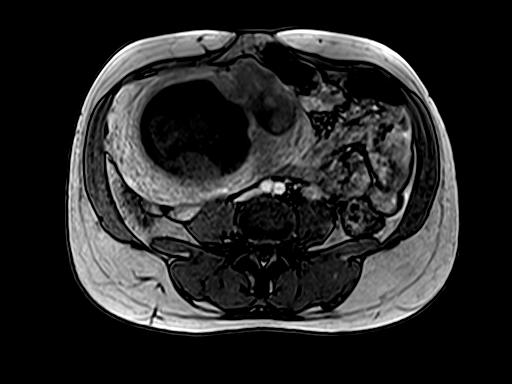

[25 of 48 positions shown; findings below may reference images not displayed]

FINDINGS: Urinary Tract:  Normal nondistended bladder.  Normal urethra.

Bowel: Normal caliber pelvic bowel loops with no bowel wall
thickening.

Vascular/Lymphatic: No acute vascular abnormality on this
noncontrast scan. No pathologically enlarged pelvic lymph nodes.

Reproductive: Single intrauterine gestation in breech lie. Please
note that this scan is not tailored for fetal evaluation. Amniotic
fluid volume appears subjectively normal. There is complete anterior
placenta previa, with extension of the placental tissue into the
anterior upper cervix. There is markedly abnormally heterogeneous
signal intensity throughout the placenta, with anterior bulging of
the lower uterine contour/widening of the lower uterine segment,
compatible with placenta accreta. No frank extrauterine placental
extension to suggest percreta.

Normal cervix length 4.9 cm. No evidence of internal cervical
funneling. Curvilinear focus of susceptibility in the lower cervix
near the external os, compatible with cervical cerclage (series
14/image 83).

There is an intramural 5.2 x 3.9 x 3.7 cm upper left uterine body
fibroid with probable internal cystic degeneration given
heterogeneous areas of internal T2 hyperintensity.

Other:  No pelvic ascites or focal fluid collections.

Musculoskeletal: No aggressive appearing focal osseous lesions.
IMPRESSION: 1. Single intrauterine gestation in breech lie. This MRI scan not
tailored for fetal evaluation.
2. Complete anterior placenta previa, with extension of placental
tissue into the anterior upper cervix.
3. Spectrum of MRI findings compatible with placenta accreta,
including bulging/widening of the lower uterine segment and markedly
abnormally heterogeneous placental signal intensity. No frank
percreta.
4. Normal cervix length 4.9 cm.  Cervical cerclage in place.
5. Intramural 5.2 cm upper left uterine body fibroid with probable
internal cystic degeneration.
